# Patient Record
Sex: Male | Born: 1990 | Race: White | Hispanic: No | Marital: Married | Smoking: Never smoker
Health system: Southern US, Community
[De-identification: ages and names within clinical notes are randomized; demographics above are authoritative.]

## PROBLEM LIST (undated history)

## (undated) DIAGNOSIS — I10 Essential (primary) hypertension: Secondary | ICD-10-CM

## (undated) DIAGNOSIS — E669 Obesity, unspecified: Secondary | ICD-10-CM

## (undated) HISTORY — DX: Obesity, unspecified: E66.9

## (undated) HISTORY — DX: Essential (primary) hypertension: I10

## (undated) HISTORY — PX: KNEE ARTHROSCOPY: SUR90

---

## 2017-11-14 ENCOUNTER — Other Ambulatory Visit: Payer: Self-pay

## 2017-11-14 ENCOUNTER — Emergency Department (HOSPITAL_BASED_OUTPATIENT_CLINIC_OR_DEPARTMENT_OTHER): Payer: Self-pay

## 2017-11-14 ENCOUNTER — Emergency Department (HOSPITAL_BASED_OUTPATIENT_CLINIC_OR_DEPARTMENT_OTHER)
Admission: EM | Admit: 2017-11-14 | Discharge: 2017-11-14 | Disposition: A | Discharge status: Home / Self Care | Payer: Self-pay | Attending: Emergency Medicine | Admitting: Emergency Medicine

## 2017-11-14 ENCOUNTER — Encounter (HOSPITAL_BASED_OUTPATIENT_CLINIC_OR_DEPARTMENT_OTHER): Payer: Self-pay | Admitting: Emergency Medicine

## 2017-11-14 DIAGNOSIS — S93601A Unspecified sprain of right foot, initial encounter: Secondary | ICD-10-CM | POA: Insufficient documentation

## 2017-11-14 DIAGNOSIS — X501XXA Overexertion from prolonged static or awkward postures, initial encounter: Secondary | ICD-10-CM | POA: Insufficient documentation

## 2017-11-14 DIAGNOSIS — Y929 Unspecified place or not applicable: Secondary | ICD-10-CM | POA: Insufficient documentation

## 2017-11-14 DIAGNOSIS — Y999 Unspecified external cause status: Secondary | ICD-10-CM | POA: Insufficient documentation

## 2017-11-14 DIAGNOSIS — Y9389 Activity, other specified: Secondary | ICD-10-CM | POA: Insufficient documentation

## 2017-11-14 MED ORDER — HYDROCODONE-ACETAMINOPHEN 5-325 MG PO TABS
1.0000 | ORAL_TABLET | Freq: Once | ORAL | Status: AC
Start: 2017-11-14 — End: 2017-11-14
  Administered 2017-11-14: 1 via ORAL
  Filled 2017-11-14: qty 1

## 2017-11-14 NOTE — ED Notes (Signed)
Pt reports not taking his BP med for over 6 months. Pt educated on HTN risks and the importance of medication. Follow up information for PCP will be provided at discharge.

## 2017-11-14 NOTE — Discharge Instructions (Addendum)
No weightbearing for the next 48 hours. Often day 3, you may slowly start bearing weight as your pain or symptoms improve. Ace wrap to prevent swelling.  Elevate whenever possible.  Apply ice tonight and tomorrow. Follow-up with Dr. Magnus IvanBlackman, orthopedic surgery, if your symptoms are not resolved within 2 weeks.

## 2017-11-14 NOTE — ED Provider Notes (Signed)
[facility] EMERGENCY DEPARTMENT Provider Note   CSN: [id number] Arrival date & time: 11/14/17  1844     History   Chief Complaint Chief Complaint  Patient presents with  . Ankle Injury    HPI Marc Nguyen is a 27 y.o. male.  Chief complaint is I twisted my ankle".  HPI 27 year old male.  He was at work [employer].  He was standing on a small stool working something.  Stool tilted.  His right foot inverted.  He has pain to his right foot and ankle.  He fell but does not have additional injuries.  He cannot walk without limp.  History reviewed. No pertinent past medical history.  There are no active problems to display for this patient.   History reviewed. No pertinent surgical history.      Home Medications    Prior to Admission medications   Not on File    Family History History reviewed. No pertinent family history.  Social History Social History   Tobacco Use  . Smoking status: Never Smoker  . Smokeless tobacco: Never Used  Substance Use Topics  . Alcohol use: Never    Frequency: Never  . Drug use: Never     Allergies   Patient has no known allergies.   Review of Systems Review of Systems  Constitutional: Negative for appetite change, chills, diaphoresis, fatigue and fever.  HENT: Negative for mouth sores, sore throat and trouble swallowing.   Eyes: Negative for visual disturbance.  Respiratory: Negative for cough, chest tightness, shortness of breath and wheezing.   Cardiovascular: Negative for chest pain.  Gastrointestinal: Negative for abdominal distention, abdominal pain, diarrhea, nausea and vomiting.  Endocrine: Negative for polydipsia, polyphagia and polyuria.  Genitourinary: Negative for dysuria, frequency and hematuria.  Musculoskeletal: Positive for arthralgias and gait problem.  Skin: Negative for color change, pallor and rash.  Neurological: Negative for dizziness, syncope, light-headedness and headaches.  Hematological:  Does not bruise/bleed easily.  Psychiatric/Behavioral: Negative for behavioral problems and confusion.     Physical Exam Updated Vital Signs BP (!) 188/130 (BP Location: Left Arm) Comment: RN Theora GianottiEliza informed of Pts both BP  Pulse 95   Temp 98.9 F (37.2 C) (Oral)   Resp 20   Ht 6\' 1"  (1.854 m)   Wt 113.4 kg (250 lb)   SpO2 100%   BMI 32.98 kg/m   Physical Exam  Constitutional: He is oriented to person, place, and time. He appears well-developed and well-nourished. No distress.  HENT:  Head: Normocephalic.  Eyes: Pupils are equal, round, and reactive to light. Conjunctivae are normal. No scleral icterus.  Neck: Normal range of motion. Neck supple. No thyromegaly present.  Cardiovascular: Normal rate and regular rhythm. Exam reveals no gallop and no friction rub.  No murmur heard. Pulmonary/Chest: Effort normal and breath sounds normal. No respiratory distress. He has no wheezes. He has no rales.  Abdominal: Soft. Bowel sounds are normal. He exhibits no distension. There is no tenderness. There is no rebound.  Musculoskeletal:  Tender palpation inferior to the right lateral malleolus and onto the fourth and fifth metatarsal.  There is some soft tissue swelling and early ecchymosis overlying the fourth and fifth metatarsal heads.  Neurological: He is alert and oriented to person, place, and time.  Skin: Skin is warm and dry. No rash noted.  Psychiatric: He has a normal mood and affect. His behavior is normal.     ED Treatments / Results  Labs (all labs ordered are listed, but  only abnormal results are displayed) Labs Reviewed - No data to display  EKG None  Radiology Dg Ankle Complete Right  Result Date: 11/14/2017 CLINICAL DATA:  Right ankle injury.  Fall. EXAM: RIGHT ANKLE - COMPLETE 3+ VIEW COMPARISON:  None. FINDINGS: There is no evidence of fracture, dislocation, or joint effusion. There is no evidence of arthropathy or other focal bone abnormality. Soft tissues are  unremarkable. IMPRESSION: Negative. Electronically Signed   By: Charlett Nose M.D.   On: 11/14/2017 19:34   Dg Foot Complete Right  Result Date: 11/14/2017 CLINICAL DATA:  27 year old male with trauma to the right foot. EXAM: RIGHT FOOT COMPLETE - 3+ VIEW COMPARISON:  Right ankle radiograph dated 11/14/2017 FINDINGS: There is no acute fracture or dislocation. The bones are well mineralized. No arthritic changes. Mild hallucis valgus. There is mild soft tissue swelling of the forefoot. IMPRESSION: Negative. Electronically Signed   By: Elgie Collard M.D.   On: 11/14/2017 23:09    Procedures Procedures (including critical care time)  Medications Ordered in ED Medications  HYDROcodone-acetaminophen (NORCO/VICODIN) 5-325 MG per tablet 1 tablet (has no administration in time range)     Initial Impression / Assessment and Plan / ED Course  I have reviewed the triage vital signs and the nursing notes.  Pertinent labs & imaging results that were available during my care of the patient were reviewed by me and considered in my medical decision making (see chart for details).    X-ray of the ankle shows no fracture.  Patient sent back over for x-rays of his foot which also are normal.  No metatarsal fracture.  No malleolus fracture.  Plan he is placed in an Ace wrap and Aircast.  Crutches.  Nonweightbearing for 48 hours.  Slowly increase weightbearing as tolerated.  Ace wrap, elevation, ice, slowly increase weightbearing.  Orthopedics if not improving.  Final Clinical Impressions(s) / ED Diagnoses   Final diagnoses:  Sprain of right foot, initial encounter    ED Discharge Orders    None       Rolland Porter, MD 11/14/17 2324

## 2017-11-14 NOTE — ED Triage Notes (Signed)
Reports right ankle pain which happened today at work when he came down on his ankle wrong.

## 2017-11-25 ENCOUNTER — Ambulatory Visit (INDEPENDENT_AMBULATORY_CARE_PROVIDER_SITE_OTHER): Payer: 59 | Admitting: Physician Assistant

## 2017-11-25 ENCOUNTER — Encounter: Payer: Self-pay | Admitting: Physician Assistant

## 2017-11-25 VITALS — BP 119/90 | HR 94 | Ht 72.0 in | Wt 265.0 lb

## 2017-11-25 DIAGNOSIS — Z9189 Other specified personal risk factors, not elsewhere classified: Secondary | ICD-10-CM | POA: Diagnosis not present

## 2017-11-25 DIAGNOSIS — I10 Essential (primary) hypertension: Secondary | ICD-10-CM

## 2017-11-25 DIAGNOSIS — R0683 Snoring: Secondary | ICD-10-CM

## 2017-11-25 DIAGNOSIS — R1013 Epigastric pain: Secondary | ICD-10-CM

## 2017-11-25 DIAGNOSIS — Z1329 Encounter for screening for other suspected endocrine disorder: Secondary | ICD-10-CM

## 2017-11-25 DIAGNOSIS — R3129 Other microscopic hematuria: Secondary | ICD-10-CM | POA: Diagnosis not present

## 2017-11-25 DIAGNOSIS — Z13220 Encounter for screening for lipoid disorders: Secondary | ICD-10-CM

## 2017-11-25 DIAGNOSIS — Z7689 Persons encountering health services in other specified circumstances: Secondary | ICD-10-CM

## 2017-11-25 DIAGNOSIS — R079 Chest pain, unspecified: Secondary | ICD-10-CM

## 2017-11-25 MED ORDER — IRBESARTAN-HYDROCHLOROTHIAZIDE 150-12.5 MG PO TABS: 1.0000 | ORAL_TABLET | ORAL | 5 refills | Status: AC

## 2017-11-25 MED ORDER — ESOMEPRAZOLE MAGNESIUM 40 MG PO CPDR: 40.0000 mg | DELAYED_RELEASE_CAPSULE | Freq: Every day | ORAL | 1 refills | Status: DC

## 2017-11-25 NOTE — Progress Notes (Signed)
HPI:                                                                Marc Nguyen is a 27 y.o. male who presents to [name]: Primary Care Sports Medicine today to establish care   HTN: reports history of severe, uncontrolled hypertension in which he has been seen in the emergency room. When his blood pressure has been elevated he has had headaches, dizziness and chest pain. prescribed HCTZ and Lisinopril. Not compliant with medications due to lack of PCP/follow-up. Does not check BP's at home. He is asymptomatic today. Wife reports loud snoring and pauses in breathing. Risk factors include: male sex, obesity, family hx Mother and maternal grandfather. Emelia Loron has had a stroke. Prior ED work-up included: Negative chest x-ray on 09/15/17 ECG NSR 03/14/17 Normal CMP, CBC 08/01/2017 Trace blood on UA  Chest pain: reports chest pain both at rest and with exertion. First began about 2 years ago at age 25. He has been seen in the ED for this, as recently as January 2018. Chest pain is recurrent, lasts for minutes to hours. Reports "mild pain" with exertion such as climbing a flight of stairs. He also endorses severe, persistent acid reflux  GERD: daily persistent burning epigastric pain, worse at night. Associated with regurgitation and vomiting. No weight loss, constitutional symptoms, hematemesis, melena or hematochezia.  No flowsheet data found.  No flowsheet data found.    Past Medical History:  Diagnosis Date  . Hypertension   . Obesity    Past Surgical History:  Procedure Laterality Date  . KNEE ARTHROSCOPY Right    Social History   Tobacco Use  . Smoking status: Never Smoker  . Smokeless tobacco: Never Used  Substance Use Topics  . Alcohol use: Never    Frequency: Never   family history includes Hypertension in his maternal grandfather and mother; Stroke in his maternal grandfather.    ROS: negative except as noted in the  HPI  Medications: Current Outpatient Medications  Medication Sig Dispense Refill  . esomeprazole (NEXIUM) 40 MG capsule Take 1 capsule (40 mg total) by mouth at bedtime. 30 capsule 1  . irbesartan-hydrochlorothiazide (AVALIDE) 150-12.5 MG tablet Take 1 tablet by mouth every morning. 30 tablet 5   No current facility-administered medications for this visit.    No Known Allergies     Objective:  BP 119/90   Pulse 94   Ht 6' (1.829 m)   Wt 265 lb (120.2 kg)   BMI 35.94 kg/m  Gen:  alert, not ill-appearing, no distress, appropriate for age, obese male HEENT: head normocephalic without obvious abnormality, conjunctiva and cornea clear, trachea midline Pulm: Normal work of breathing, normal phonation, clear to auscultation bilaterally, no wheezes, rales or rhonchi CV: Normal rate, regular rhythm, s1 and s2 distinct, no murmurs, clicks or rubs  GI: abdomen soft, non-tender, no guarding or rigidity, no palpable masses, exam limited due to body habitus Neuro: alert and oriented x 3, no tremor MSK: extremities atraumatic, normal gait and station Skin: intact, no rashes on exposed skin, no jaundice, no cyanosis Psych: well-groomed, cooperative, good eye contact, euthymic mood, affect mood-congruent, speech is articulate, and thought processes clear and goal-directed    No results found for this or any  previous visit (from the past 72 hour(s)). No results found.    Assessment and Plan: 27 y.o. male with   1. Encounter to establish care - Personally reviewed PMH, PSH, PFH, medications, allergies, HM - Age-appropriate cancer screening: n/a - Influenza n/a - Tdap declines - PHQ2 negative   2. Hypertension goal BP (blood pressure) < 130/80 BP Readings from Last 3 Encounters:  11/25/17 119/90  11/14/17 (!) 167/122  - history of uncontrolled stage 2 HTN. Starting combination anti-hypertensive medication - counseled on therapeutic lifestyle changes - given young age and +  screening will test for OSA   3. At risk for obstructive sleep apnea - STOP BANG + loud snoring, + male sex, + obesity - Home sleep test; Future  4. Loud snoring - Home sleep test; Future  5. Dyspepsia - 4 week trial of high-dose PPI, GERD diet, follow-up in 4 weeks  6. Exertional chest pain - CVD risk factors include: male, obesity, HTN, fam hx - Exercise Tolerance Test; Future  Encounter to establish care  Hypertension goal BP (blood pressure) < 130/80 - Plan: irbesartan-hydrochlorothiazide (AVALIDE) 150-12.5 MG tablet, Urinalysis, Routine w reflex microscopic, BASIC METABOLIC PANEL WITH GFR, TSH + free T4, Lipid Panel w/reflex Direct LDL  At risk for obstructive sleep apnea - Plan: Home sleep test  Loud snoring - Plan: Home sleep test  Dyspepsia - Plan: esomeprazole (NEXIUM) 40 MG capsule  Exertional chest pain - Plan: Exercise Tolerance Test  Screening for thyroid disorder - Plan: TSH + free T4  Screening for lipid disorders - Plan: Lipid Panel w/reflex Direct LDL  Microscopic hematuria - Plan: Urinalysis, Routine w reflex microscopic    Patient education and anticipatory guidance given Patient agrees with treatment plan Follow-up in 4 weeks for HTN/GERD or sooner as needed if symptoms worsen or fail to improve  Levonne Hubertharley E. Tobyn Osgood PA-C

## 2017-11-25 NOTE — Patient Instructions (Addendum)
For your blood pressure: - Goal <130/80 - start Avalide every morning - monitor and log blood pressures at home - check around the same time each day in a relaxed setting - Limit salt to <2000 mg/day - Follow DASH eating plan - limit alcohol to 2 standard drinks per day for men and 1 per day for women - avoid tobacco products - weight loss: 7% of current body weight - follow-up every 6 months for your blood pressure   For your acid reflux: - start Nexium in the evening before bed - follow a GERD diet (see below) - reduce caloric intake and increase physical activity to lose weight  Food Choices for Gastroesophageal Reflux Disease, Adult When you have gastroesophageal reflux disease (GERD), the foods you eat and your eating habits are very important. Choosing the right foods can help ease your discomfort. What guidelines do I need to follow?  Choose fruits, vegetables, whole grains, and low-fat dairy products.  Choose low-fat meat, fish, and poultry.  Limit fats such as oils, salad dressings, butter, nuts, and avocado.  Keep a food diary. This helps you identify foods that cause symptoms.  Avoid foods that cause symptoms. These may be different for everyone.  Eat small meals often instead of 3 large meals a day.  Eat your meals slowly, in a place where you are relaxed.  Limit fried foods.  Cook foods using methods other than frying.  Avoid drinking alcohol.  Avoid drinking large amounts of liquids with your meals.  Avoid bending over or lying down until 2-3 hours after eating. What foods are not recommended? These are some foods and drinks that may make your symptoms worse: Vegetables Tomatoes. Tomato juice. Tomato and spaghetti sauce. Chili peppers. Onion and garlic. Horseradish. Fruits Oranges, grapefruit, and lemon (fruit and juice). Meats High-fat meats, fish, and poultry. This includes hot dogs, ribs, ham, sausage, salami, and bacon. Dairy Whole milk and  chocolate milk. Sour cream. Cream. Butter. Ice cream. Cream cheese. Drinks Coffee and tea. Bubbly (carbonated) drinks or energy drinks. Condiments Hot sauce. Barbecue sauce. Sweets/Desserts Chocolate and cocoa. Donuts. Peppermint and spearmint. Fats and Oils High-fat foods. This includes [place] fries and potato chips. Other Vinegar. Strong spices. This includes black pepper, white pepper, red pepper, cayenne, curry powder, cloves, ginger, and chili powder. The items listed above may not be a complete list of foods and drinks to avoid. Contact your dietitian for more information. This information is not intended to replace advice given to you by your health care provider. Make sure you discuss any questions you have with your health care provider. Document Released: 02/08/2012 Document Revised: 01/15/2016 Document Reviewed: 06/13/2013 Elsevier Interactive Patient Education  2017 [employer]

## 2017-12-04 ENCOUNTER — Encounter: Payer: Self-pay | Admitting: Physician Assistant

## 2017-12-04 DIAGNOSIS — R079 Chest pain, unspecified: Secondary | ICD-10-CM | POA: Insufficient documentation

## 2017-12-04 DIAGNOSIS — R1013 Epigastric pain: Secondary | ICD-10-CM | POA: Insufficient documentation

## 2017-12-04 DIAGNOSIS — I10 Essential (primary) hypertension: Secondary | ICD-10-CM | POA: Insufficient documentation

## 2017-12-04 DIAGNOSIS — Z9189 Other specified personal risk factors, not elsewhere classified: Secondary | ICD-10-CM | POA: Insufficient documentation

## 2017-12-23 ENCOUNTER — Encounter: Payer: Self-pay | Admitting: Physician Assistant

## 2017-12-23 ENCOUNTER — Ambulatory Visit (INDEPENDENT_AMBULATORY_CARE_PROVIDER_SITE_OTHER): Payer: 59 | Admitting: Physician Assistant

## 2017-12-23 VITALS — BP 163/108 | HR 83 | Wt 268.0 lb

## 2017-12-23 DIAGNOSIS — I10 Essential (primary) hypertension: Secondary | ICD-10-CM | POA: Diagnosis not present

## 2017-12-23 DIAGNOSIS — R1013 Epigastric pain: Secondary | ICD-10-CM | POA: Diagnosis not present

## 2017-12-23 MED ORDER — ESOMEPRAZOLE MAGNESIUM 20 MG PO CPDR: 20.0000 mg | DELAYED_RELEASE_CAPSULE | Freq: Every day | ORAL | 5 refills | Status: AC

## 2017-12-23 MED ORDER — VALSARTAN-HYDROCHLOROTHIAZIDE 320-25 MG PO TABS: 1.0000 | ORAL_TABLET | Freq: Every day | ORAL | 5 refills | Status: AC

## 2017-12-23 NOTE — Patient Instructions (Addendum)
For your blood pressure: - Goal <130/80 - switch to Valsartan-Hydrochlorothiazide. Take every morning - monitor and log blood pressures at home - check around the same time each day in a relaxed setting - Limit salt to <2000 mg/day - Follow DASH eating plan - limit alcohol to 2 standard drinks per day for men and 1 per day for women - avoid tobacco products - weight loss: 7% of current body weight - follow-up every 6 months for your blood pressure    DASH Eating Plan DASH stands for "Dietary Approaches to Stop Hypertension." The DASH eating plan is a healthy eating plan that has been shown to reduce high blood pressure (hypertension). It may also reduce your risk for type 2 diabetes, heart disease, and stroke. The DASH eating plan may also help with weight loss. What are tips for following this plan? General guidelines  Avoid eating more than 2,300 mg (milligrams) of salt (sodium) a day. If you have hypertension, you may need to reduce your sodium intake to 1,500 mg a day.  Limit alcohol intake to no more than 1 drink a day for nonpregnant women and 2 drinks a day for men. One drink equals 12 oz of beer, 5 oz of wine, or 1 oz of hard liquor.  Work with your health care provider to maintain a healthy body weight or to lose weight. Ask what an ideal weight is for you.  Get at least 30 minutes of exercise that causes your heart to beat faster (aerobic exercise) most days of the week. Activities may include walking, swimming, or biking.  Work with your health care provider or diet and nutrition specialist (dietitian) to adjust your eating plan to your individual calorie needs. Reading food labels  Check food labels for the amount of sodium per serving. Choose foods with less than 5 percent of the Daily Value of sodium. Generally, foods with less than 300 mg of sodium per serving fit into this eating plan.  To find whole grains, look for the word "whole" as the first word in the ingredient  list. Shopping  Buy products labeled as "low-sodium" or "no salt added."  Buy fresh foods. Avoid canned foods and premade or frozen meals. Cooking  Avoid adding salt when cooking. Use salt-free seasonings or herbs instead of table salt or sea salt. Check with your health care provider or pharmacist before using salt substitutes.  Do not fry foods. Cook foods using healthy methods such as baking, boiling, grilling, and broiling instead.  Cook with heart-healthy oils, such as olive, canola, soybean, or sunflower oil. Meal planning   Eat a balanced diet that includes: ? 5 or more servings of fruits and vegetables each day. At each meal, try to fill half of your plate with fruits and vegetables. ? Up to 6-8 servings of whole grains each day. ? Less than 6 oz of lean meat, poultry, or fish each day. A 3-oz serving of meat is about the same size as a deck of cards. One egg equals 1 oz. ? 2 servings of low-fat dairy each day. ? A serving of nuts, seeds, or beans 5 times each week. ? Heart-healthy fats. Healthy fats called Omega-3 fatty acids are found in foods such as flaxseeds and coldwater fish, like sardines, salmon, and mackerel.  Limit how much you eat of the following: ? Canned or prepackaged foods. ? Food that is high in trans fat, such as fried foods. ? Food that is high in saturated fat, such as  fatty meat. ? Sweets, desserts, sugary drinks, and other foods with added sugar. ? Full-fat dairy products.  Do not salt foods before eating.  Try to eat at least 2 vegetarian meals each week.  Eat more home-cooked food and less restaurant, buffet, and fast food.  When eating at a restaurant, ask that your food be prepared with less salt or no salt, if possible. What foods are recommended? The items listed may not be a complete list. Talk with your dietitian about what dietary choices are best for you. Grains Whole-grain or whole-wheat bread. Whole-grain or whole-wheat pasta. Brown  rice. [place] Morrow. Bulgur. Whole-grain and low-sodium cereals. Pita bread. Low-fat, low-sodium crackers. Whole-wheat flour tortillas. Vegetables Fresh or frozen vegetables (raw, steamed, roasted, or grilled). Low-sodium or reduced-sodium tomato and vegetable juice. Low-sodium or reduced-sodium tomato sauce and tomato paste. Low-sodium or reduced-sodium canned vegetables. Fruits All fresh, dried, or frozen fruit. Canned fruit in natural juice (without added sugar). Meat and other protein foods Skinless chicken or [place]. Ground chicken or [place]. Pork with fat trimmed off. Fish and seafood. Egg whites. Dried beans, peas, or lentils. Unsalted nuts, nut butters, and seeds. Unsalted canned beans. Lean cuts of beef with fat trimmed off. Low-sodium, lean deli meat. Dairy Low-fat (1%) or fat-free (skim) milk. Fat-free, low-fat, or reduced-fat cheeses. Nonfat, low-sodium ricotta or cottage cheese. Low-fat or nonfat yogurt. Low-fat, low-sodium cheese. Fats and oils Soft margarine without trans fats. Vegetable oil. Low-fat, reduced-fat, or light mayonnaise and salad dressings (reduced-sodium). Canola, safflower, olive, soybean, and sunflower oils. Avocado. Seasoning and other foods Herbs. Spices. Seasoning mixes without salt. Unsalted popcorn and pretzels. Fat-free sweets. What foods are not recommended? The items listed may not be a complete list. Talk with your dietitian about what dietary choices are best for you. Grains Baked goods made with fat, such as croissants, muffins, or some breads. Dry pasta or rice meal packs. Vegetables Creamed or fried vegetables. Vegetables in a cheese sauce. Regular canned vegetables (not low-sodium or reduced-sodium). Regular canned tomato sauce and paste (not low-sodium or reduced-sodium). Regular tomato and vegetable juice (not low-sodium or reduced-sodium). Angie Fava. Olives. Fruits Canned fruit in a light or heavy syrup. Fried fruit. Fruit in cream or butter  sauce. Meat and other protein foods Fatty cuts of meat. Ribs. Fried meat. Berniece Salines. Sausage. Bologna and other processed lunch meats. Salami. Fatback. Hotdogs. Bratwurst. Salted nuts and seeds. Canned beans with added salt. Canned or smoked fish. Whole eggs or egg yolks. Chicken or [place] with skin. Dairy Whole or 2% milk, cream, and half-and-half. Whole or full-fat cream cheese. Whole-fat or sweetened yogurt. Full-fat cheese. Nondairy creamers. Whipped toppings. Processed cheese and cheese spreads. Fats and oils Butter. Stick margarine. Lard. Shortening. Ghee. Bacon fat. Tropical oils, such as coconut, palm kernel, or palm oil. Seasoning and other foods Salted popcorn and pretzels. Onion salt, garlic salt, seasoned salt, table salt, and sea salt. Worcestershire sauce. Tartar sauce. Barbecue sauce. Teriyaki sauce. Soy sauce, including reduced-sodium. Steak sauce. Canned and packaged gravies. Fish sauce. Oyster sauce. Cocktail sauce. Horseradish that you find on the shelf. Ketchup. Mustard. Meat flavorings and tenderizers. Bouillon cubes. Hot sauce and Tabasco sauce. Premade or packaged marinades. Premade or packaged taco seasonings. Relishes. Regular salad dressings. Where to find more information:  National Heart, Lung, and [place]: https://wilson-eaton.com/  American Heart Association: www.heart.org Summary  The DASH eating plan is a healthy eating plan that has been shown to reduce high blood pressure (hypertension). It may also reduce your risk for  type 2 diabetes, heart disease, and stroke.  With the DASH eating plan, you should limit salt (sodium) intake to 2,300 mg a day. If you have hypertension, you may need to reduce your sodium intake to 1,500 mg a day.  When on the DASH eating plan, aim to eat more fresh fruits and vegetables, whole grains, lean proteins, low-fat dairy, and heart-healthy fats.  Work with your health care provider or diet and nutrition specialist (dietitian) to adjust  your eating plan to your individual calorie needs. This information is not intended to replace advice given to you by your health care provider. Make sure you discuss any questions you have with your health care provider. Document Released: 07/29/2011 Document Revised: 08/02/2016 Document Reviewed: 08/02/2016 Elsevier Interactive Patient Education  [employer].

## 2017-12-23 NOTE — Progress Notes (Signed)
HPI:                                                                Marc Nguyen is a 27 y.o. male who presents to [facility] Marc Nguyen: Primary Care Sports Medicine today for hypertension and dyspepsia follow-up   HTN: taking Irbesartan-HCTZ 150-12.5 daily. Compliant with medications. Does not monitor BP at home. Has not had any chest pain since his last office visit 1 month ago History of severe, uncontrolled hypertension in which he has been seen in the emergency room. When his blood pressure has been elevated he has had headaches, dizziness and chest pain. prescribed HCTZ and Lisinopril. Not compliant with medications due to lack of PCP/follow-up. Does not check BP's at home. He is asymptomatic today. Wife reports loud snoring and pauses in breathing. Risk factors include: male sex, obesity, family hx Mother and maternal grandfather. Marc Nguyen has had a stroke. Prior ED work-up included: Negative chest x-ray on 09/15/17 ECG NSR 03/14/17 Normal CMP, CBC 08/01/2017 Trace blood on UA  Chest pain: reports chest pain both at rest and with exertion. First began about 2 years ago at age 51. He has been seen in the ED for this, as recently as January 2018. Chest pain is recurrent, lasts for minutes to hours. Reports "mild pain" with exertion such as climbing a flight of stairs. He also endorses severe, persistent acid reflux  GERD: has been taking Nexium 40 mg for the last 4 weeks. Epigastric pain and nausea/vomiting have resoved. He has also cut out soda. No weight loss, constitutional symptoms, hematemesis, melena or hematochezia.  He has not yet scheduled his sleep study. He was waiting on his insurance card.  No flowsheet data found.  No flowsheet data found.    Past Medical History:  Diagnosis Date  . Hypertension   . Obesity    Past Surgical History:  Procedure Laterality Date  . KNEE ARTHROSCOPY Right    Social History   Tobacco Use  . Smoking status: Never  Smoker  . Smokeless tobacco: Never Used  Substance Use Topics  . Alcohol use: Never    Frequency: Never   family history includes Hypertension in his maternal grandfather and mother; Stroke in his maternal grandfather.    ROS: negative except as noted in the HPI  Medications: Current Outpatient Medications  Medication Sig Dispense Refill  . esomeprazole (NEXIUM) 40 MG capsule Take 1 capsule (40 mg total) by mouth at bedtime. 30 capsule 1  . irbesartan-hydrochlorothiazide (AVALIDE) 150-12.5 MG tablet Take 1 tablet by mouth every morning. 30 tablet 5   No current facility-administered medications for this visit.    No Known Allergies     Objective:  BP (!) 163/108   Pulse 83   Wt 268 lb (121.6 kg)   BMI 36.35 kg/m  Gen:  alert, not ill-appearing, no distress, appropriate for age, obese male HEENT: head normocephalic without obvious abnormality, conjunctiva and cornea clear, wearing glasses, trachea midline,  Pulm: Normal work of breathing, normal phonation, clear to auscultation bilaterally, no wheezes, rales or rhonchi CV: Normal rate, regular rhythm, s1 and s2 distinct, no murmurs, clicks or rubs; radial pulses 2+ symmetric, no carotid bruit Neuro: alert and oriented x 3, no tremor MSK: extremities atraumatic, normal gait and  station Skin: intact, no rashes on exposed skin, no jaundice, no cyanosis Psych: well-groomed, cooperative, good eye contact, euthymic mood, affect mood-congruent, speech is articulate, and thought processes clear and goal-directed    No results found for this or any previous visit (from the past 72 hour(s)). No results found.    Assessment and Plan: 27 y.o. male with   Uncontrolled stage 2 hypertension  Dyspepsia - Plan: esomeprazole (NEXIUM) 20 MG capsule  Hypertension goal BP (blood pressure) < 130/80 - Plan: valsartan-hydrochlorothiazide (DIOVAN-HCT) 320-25 MG tablet  BP Readings from Last 3 Encounters:  12/23/17 (!) 163/108   11/25/17 119/90  11/14/17 (!) 167/122  - BP severely out of range today despite ARB-thiazide medication. Sleep study is pending. Switching to max dose Valsartan-HCTZ 320-25. Counseled on therapeutic lifestyle changes. Patient to monitor BP at home and return for nurse BP check in 2 weeks. - Chest pain has improved since starting Nexium. Still recommend exercise stress test given CVD risk factors. This was ordered 1 month ago but has not been scheduled. - reducing Nexium to 20 mg nightly  Patient education and anticipatory guidance given Patient agrees with treatment plan Follow-up in 2weeks for nurse BP check or sooner as needed if symptoms worsen or fail to improve  Marc Hubert PA-C

## 2018-01-06 ENCOUNTER — Ambulatory Visit: Payer: 59

## 2018-03-07 ENCOUNTER — Encounter: Payer: Self-pay | Admitting: Cardiovascular Disease

## 2019-05-17 IMAGING — DX DG ANKLE COMPLETE 3+V*R*
3 series · 3 of 3 positions shown · non-contrast
Comparison: None.

CLINICAL DATA: Right ankle injury.  Fall.

EXAM:
RIGHT ANKLE - COMPLETE 3+ VIEW

[ankle ap]
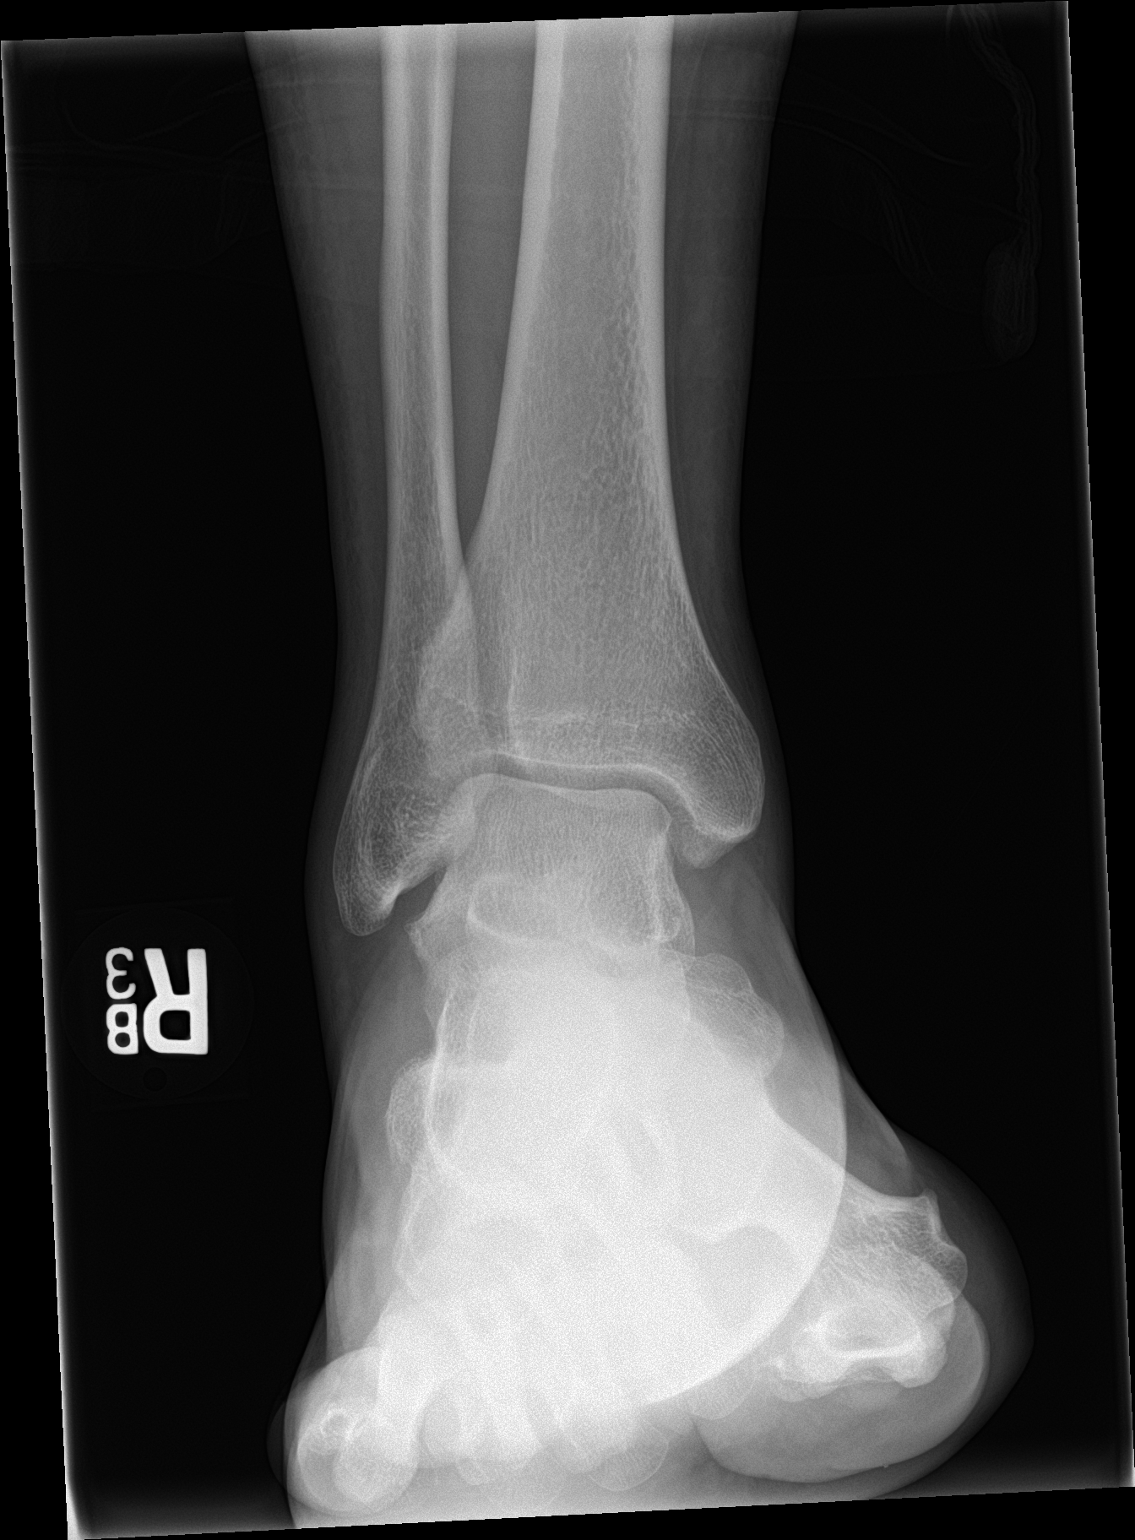

[ankle obl]
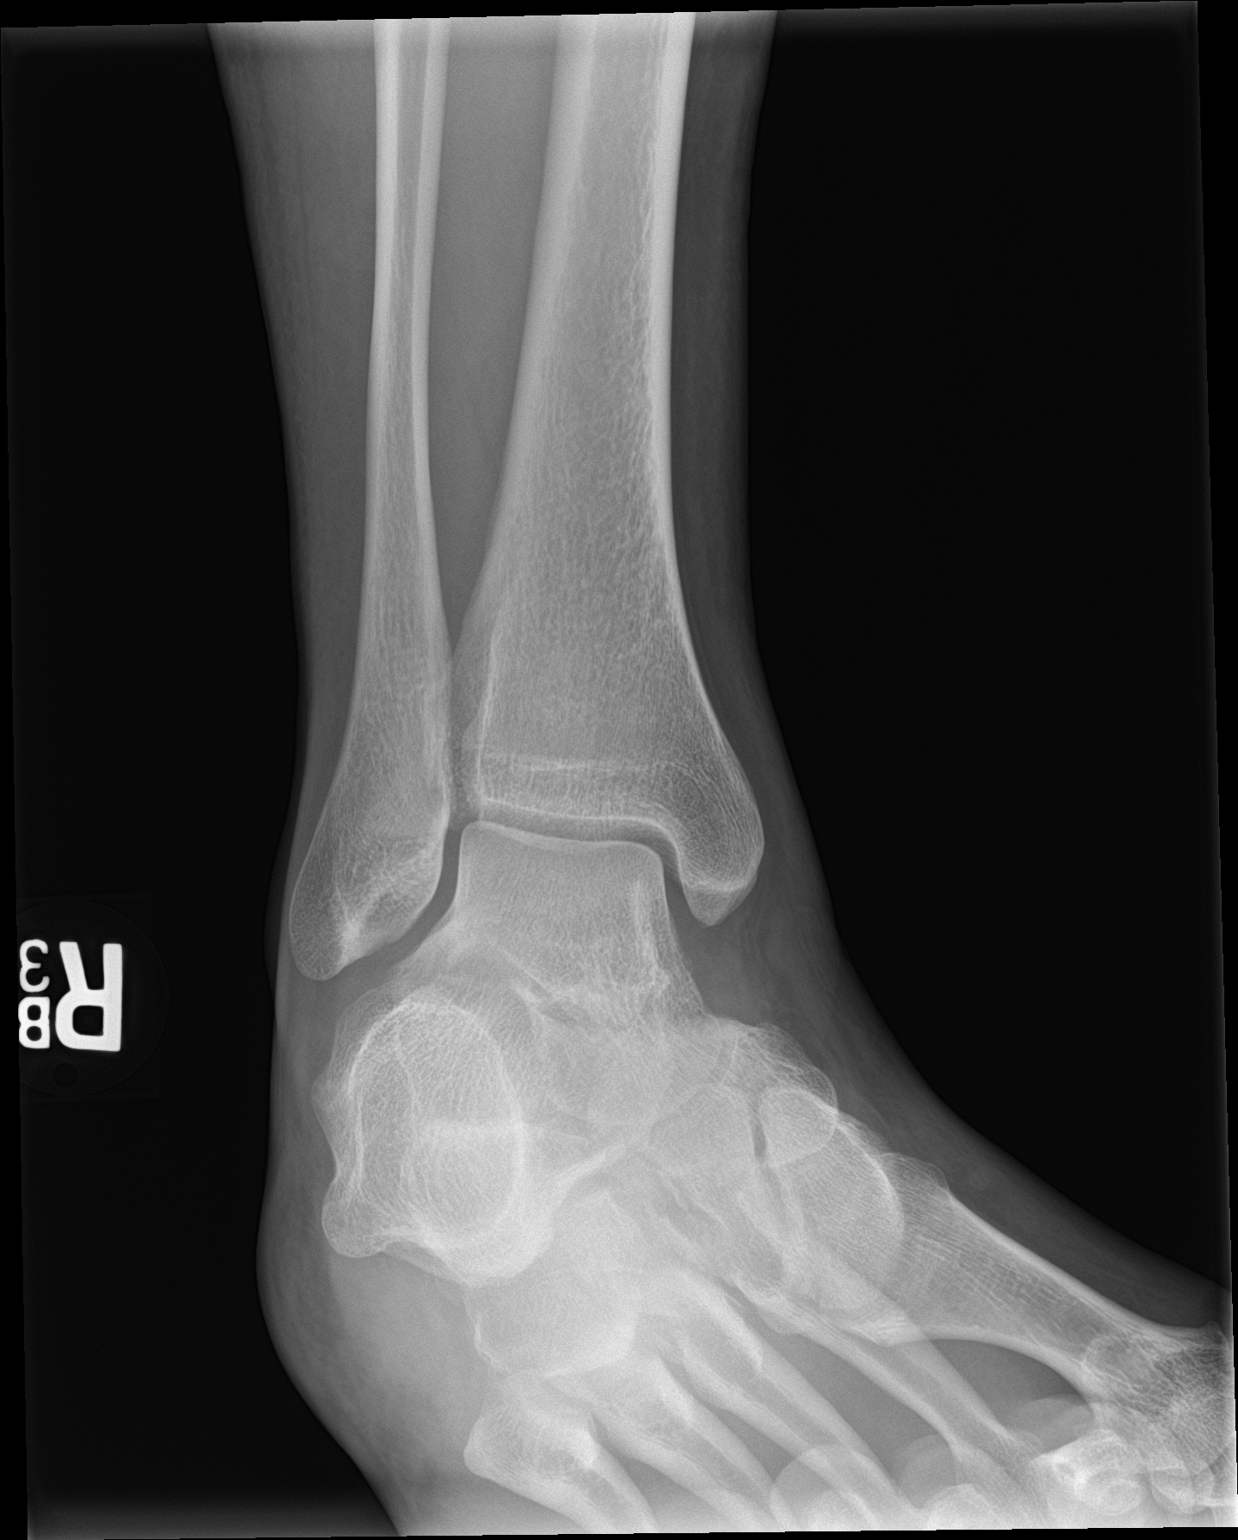

[ankle lat]
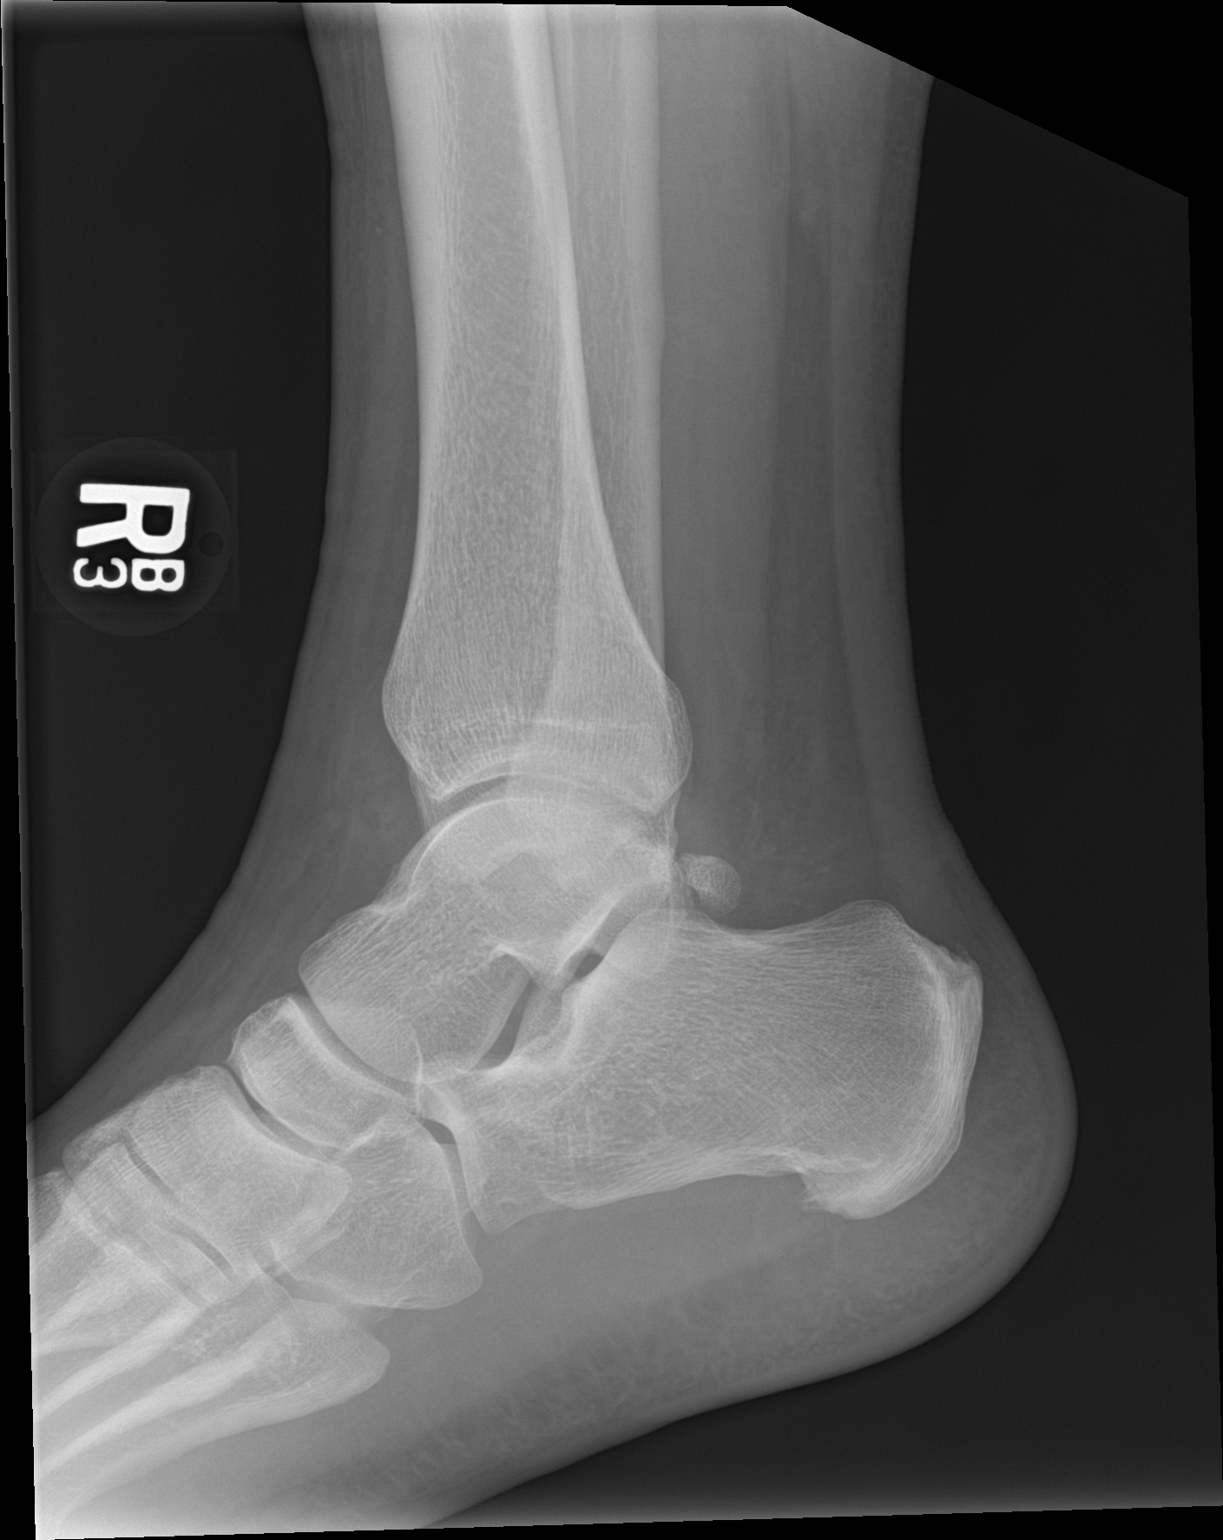

[3 of 3 positions shown; findings below may reference images not displayed]

FINDINGS: There is no evidence of fracture, dislocation, or joint effusion.
There is no evidence of arthropathy or other focal bone abnormality.
Soft tissues are unremarkable.
IMPRESSION: Negative.

## 2019-05-17 IMAGING — DX DG FOOT COMPLETE 3+V*R*
3 series · 3 of 3 positions shown · non-contrast
Comparison: Right ankle radiograph dated 11/14/2017

CLINICAL DATA: 26-year-old male with trauma to the right foot.

EXAM:
RIGHT FOOT COMPLETE - 3+ VIEW

[foot ap]
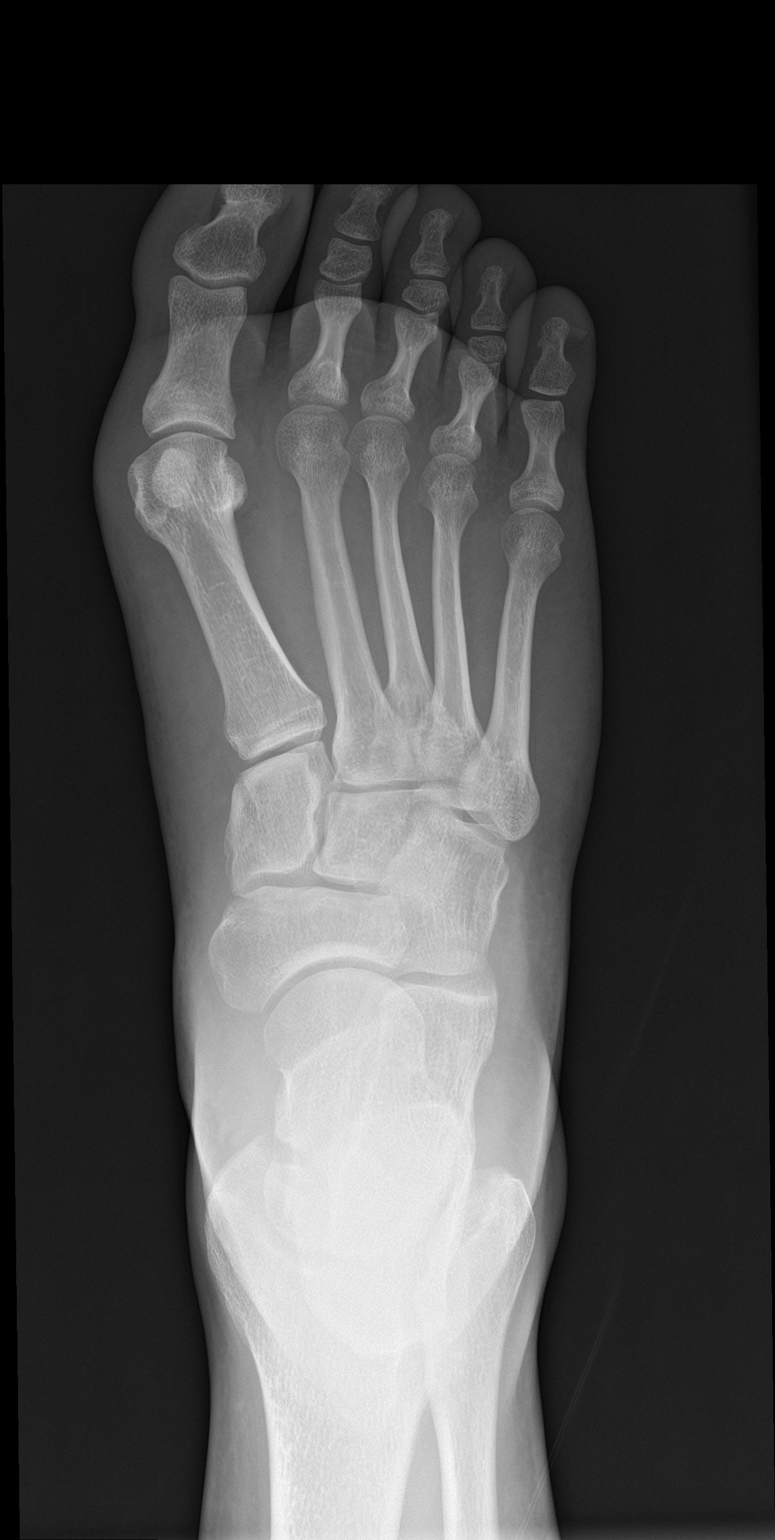

[foot obl]
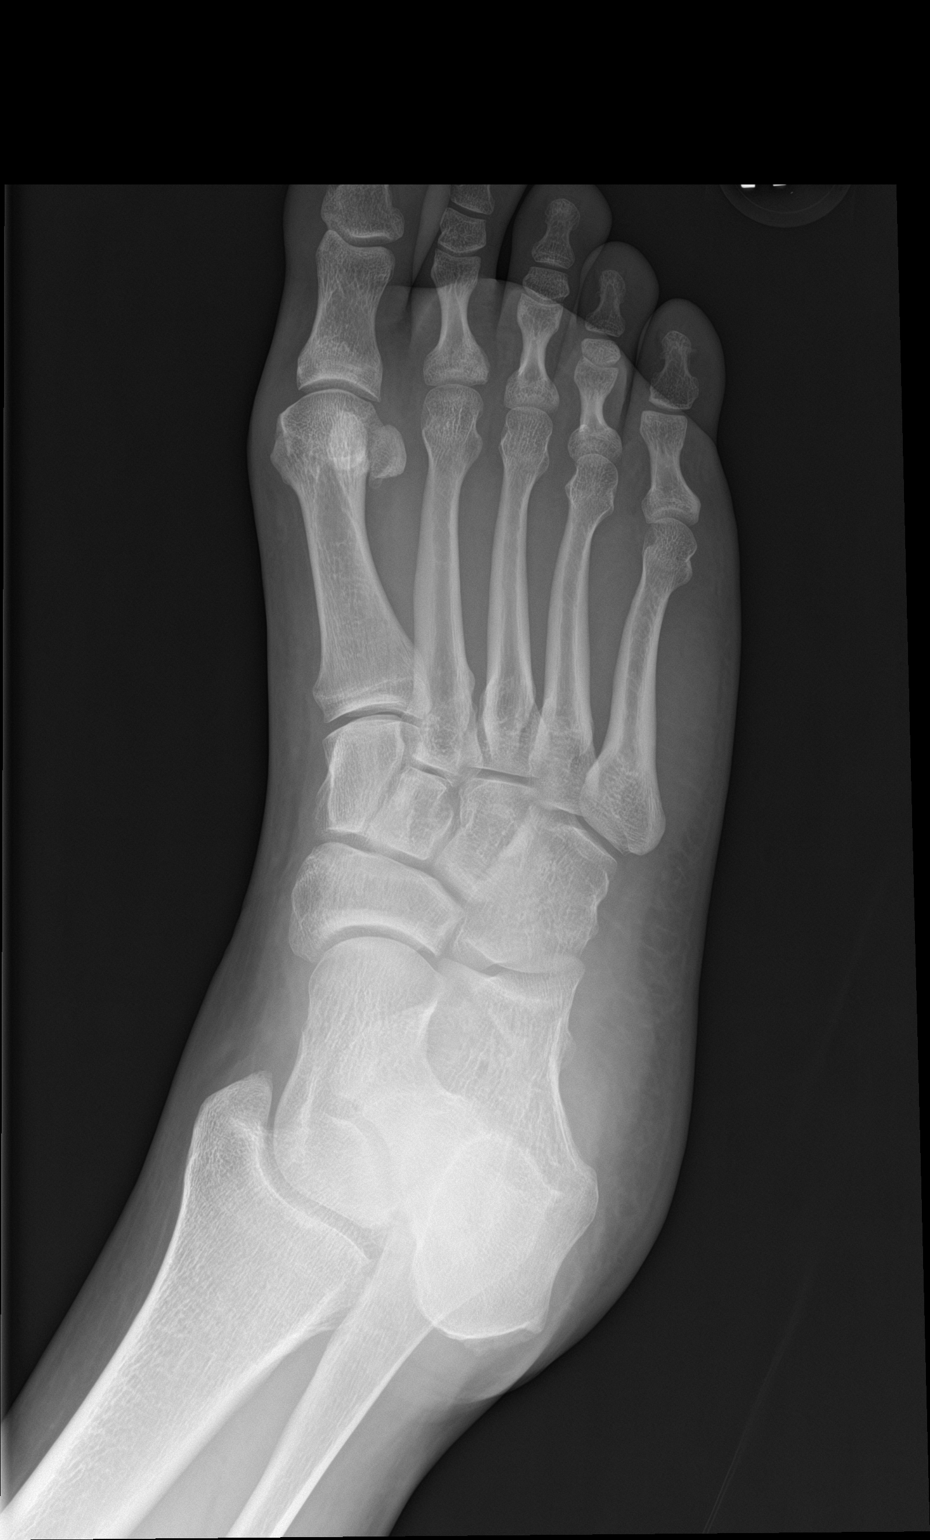

[foot lat]
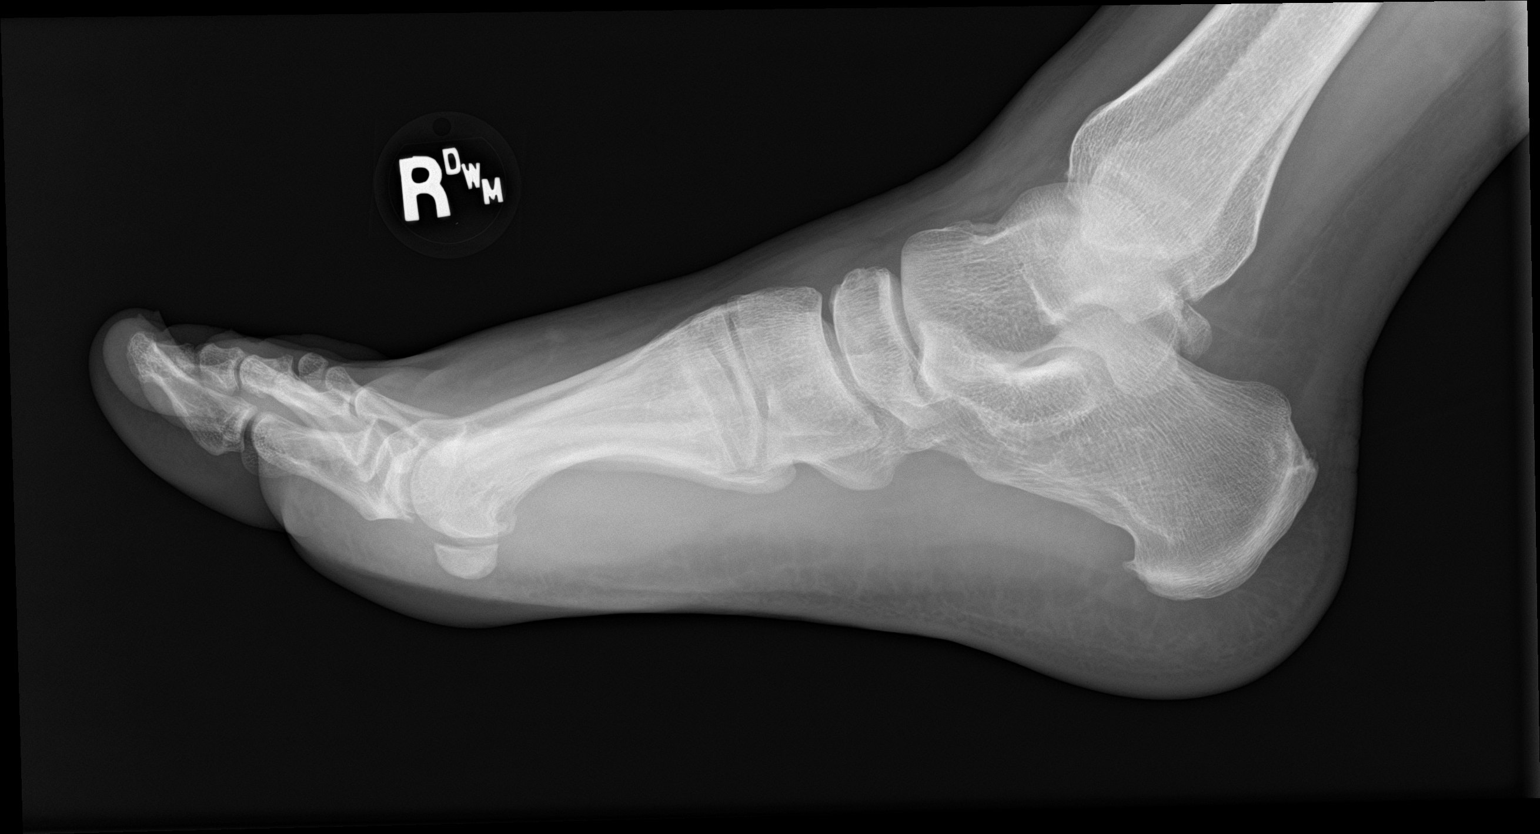

[3 of 3 positions shown; findings below may reference images not displayed]

FINDINGS: There is no acute fracture or dislocation. The bones are well
mineralized. No arthritic changes. Mild hallucis valgus. There is
mild soft tissue swelling of the forefoot.
IMPRESSION: Negative.

## 2024-06-20 ENCOUNTER — Other Ambulatory Visit (HOSPITAL_BASED_OUTPATIENT_CLINIC_OR_DEPARTMENT_OTHER): Payer: Self-pay | Admitting: Student

## 2024-06-20 DIAGNOSIS — M5412 Radiculopathy, cervical region: Secondary | ICD-10-CM

## 2024-06-20 DIAGNOSIS — M5416 Radiculopathy, lumbar region: Secondary | ICD-10-CM

## 2024-06-26 ENCOUNTER — Ambulatory Visit (HOSPITAL_BASED_OUTPATIENT_CLINIC_OR_DEPARTMENT_OTHER)
Admission: RE | Admit: 2024-06-26 | Discharge: 2024-06-26 | Disposition: A | Discharge status: Home / Self Care | Source: Ambulatory Visit | Attending: Student | Admitting: Student

## 2024-06-26 DIAGNOSIS — M5412 Radiculopathy, cervical region: Secondary | ICD-10-CM | POA: Insufficient documentation

## 2024-06-26 DIAGNOSIS — M5416 Radiculopathy, lumbar region: Secondary | ICD-10-CM | POA: Diagnosis present

## 2024-07-25 ENCOUNTER — Encounter: Payer: Self-pay | Admitting: Student

## 2024-07-26 ENCOUNTER — Other Ambulatory Visit: Payer: Self-pay | Admitting: Student

## 2024-07-26 DIAGNOSIS — M5414 Radiculopathy, thoracic region: Secondary | ICD-10-CM

## 2024-07-30 ENCOUNTER — Encounter: Payer: Self-pay | Admitting: Student

## 2024-07-31 NOTE — Discharge Instructions (Signed)

## 2024-08-01 ENCOUNTER — Other Ambulatory Visit: Payer: Self-pay | Admitting: Student

## 2024-08-01 ENCOUNTER — Inpatient Hospital Stay: Admission: RE | Admit: 2024-08-01 | Discharge: 2024-08-01 | Attending: Student

## 2024-08-01 ENCOUNTER — Inpatient Hospital Stay
Admission: RE | Admit: 2024-08-01 | Discharge: 2024-08-01 | Disposition: A | Discharge status: Home / Self Care | Payer: Self-pay | Source: Ambulatory Visit | Attending: Student | Admitting: Student

## 2024-08-01 DIAGNOSIS — M5414 Radiculopathy, thoracic region: Secondary | ICD-10-CM

## 2024-08-01 MED ORDER — DIAZEPAM 5 MG PO TABS
10.0000 mg | ORAL_TABLET | Freq: Once | ORAL | Status: AC
  Administered 2024-08-01: 10 mg via ORAL

## 2024-08-01 MED ORDER — ONDANSETRON HCL 4 MG/2ML IJ SOLN: 4.0000 mg | Freq: Once | INTRAMUSCULAR | Status: DC | PRN

## 2024-08-01 MED ORDER — IOPAMIDOL (ISOVUE-M 300) INJECTION 61%
10.0000 mL | Freq: Once | INTRAMUSCULAR | Status: AC
  Administered 2024-08-01: 11:00:00 10 mL via INTRATHECAL

## 2024-08-01 MED ORDER — MEPERIDINE HCL 50 MG/ML IJ SOLN: 50.0000 mg | Freq: Once | INTRAMUSCULAR | Status: DC | PRN

## 2024-09-10 NOTE — Therapy (Incomplete)
 " OUTPATIENT PHYSICAL THERAPY THORACOLUMBAR EVALUATION   Patient Name: Marc Nguyen MRN: [id number] DOB:1990/08/25, 34 y.o., male Today's Date: 09/10/2024  END OF SESSION:   Past Medical History:  Diagnosis Date   Hypertension    Obesity    Past Surgical History:  Procedure Laterality Date   KNEE ARTHROSCOPY Right    Patient Active Problem List   Diagnosis Date Noted   Hypertension goal BP (blood pressure) < 130/80 12/04/2017   At risk for obstructive sleep apnea 12/04/2017   Dyspepsia 12/04/2017   Exertional chest pain 12/04/2017    PCP: Tommas Severa Norris, PA-C (Inactive)   REFERRING PROVIDER: Orlean Suzen Lacks, NP *** (remove blue hyperlink)  REFERRING DIAG: M54.14 (ICD-10-CM) - Radiculopathy, thoracic region   THERAPY DIAG:  No diagnosis found.  RATIONALE FOR EVALUATION AND TREATMENT: {HABREHAB:27488}  ONSET DATE: ***  NEXT MD VISIT: ***   SUBJECTIVE:                                                                                                                                                                                                         SUBJECTIVE STATEMENT: ***  ***34 year old patient comes in today to review the results of his CT myelogram of thoracic spine.  He continues to have been burning sensation in his back.  He has tried physical therapy which actually made his pain worse.  His cervical and lumbar MRI were unremarkable.  The pain wakes him up at night.  He states that he has pain all the time.  Takes over-the-counter medication with little relief. ***  PAIN: Are you having pain? {OPRCPAIN:27236}  PERTINENT HISTORY:  ***  PRECAUTIONS: {Therapy precautions:24002}  RED FLAGS: {PT Red Flags:29287}  WEIGHT BEARING RESTRICTIONS: {Yes ***/No:24003}  FALLS:  Has patient fallen in last 6 months? {fallsyesno:27318}  LIVING ENVIRONMENT: Lives with: {OPRC lives with:25569::lives with their family} Lives in: {Lives  in:25570} Stairs: {opstairs:27293} Has following equipment at home: {Assistive devices:23999}  OCCUPATION: ***  PLOF: {PLOF:24004}  PATIENT GOALS: ***   OBJECTIVE: (objective measures completed at initial evaluation unless otherwise dated)  DIAGNOSTIC FINDINGS:  CT myelogram thoracic spine: Per NP report, he does have several bridging anterior osteophyte, no spinal cord compression or foraminal narrowing.  He does have scoliosis as well.  08/01/24 - THORACIC MYELOGRAM UNDER FLUOROSCOPY / CT OF THE THORACIC SPINE WITH INTRATHECAL CONTRAST CLINICAL HISTORY: Thoracic Radiculopathy   FINDINGS:   BONES AND ALIGNMENT: 12th rib - bearing thoracic segments assigned at T1 - T12. There is a mild mid thoracic dextroscoliosis with apex at T8 without evident  underlying vertebral segmentation anomaly. The vertebral body heights are maintained, with the exception of a mild wedge deformity of T7 right greater than left. No osseous destructive lesion is seen.   DEGENERATIVE CHANGES: C7-T3: Unremarkable.   T3-T4: Early left costovertebral degenerative joint disease. Central canal and foramina are patent.   T4-T6: Unremarkable.   T6-T7: Mild wedge deformity of T7 body left greater than right with bridging lateral endplate osteophytes. Right facet degenerative joint disease resulting in mild foraminal encroachment.   T7-T8: Moderate wedge deformity of the T8 body, left greater than right, with bridging left lateral endplate osteophytes.   T8-T10: Continued left lateral bridging by confluent endplate osteophytes.  T10-11: Anterior endplate spurs. Left facet degenerative joint disease with mild encroachment upon the neural foramen. Central canal widely patent.   T11-T12: Moderate anterior endplate osteophytes. Mild posterior disc bulge without cord distortion. No spinal stenosis. Foramina are patent.   T12-L1: Small anterior endplate spurs with early vacuum phenomenon. No disc  bulge or protrusion. Central canal and foramina are patent.   Conus terminates behind L1. The visualized upper lumbar spine is unremarkable.   SOFT TISSUES: No paraspinal mass or fluid collection. Small hiatal hernia. The remainder of the visualized paraspinal soft tissues are unremarkable.   IMPRESSION: 1. Mild mid thoracic dextroscoliosis with apex at T8 without evident vertebral segmentation anomaly. 2. Mild wedge deformity of T7 right greater than left and moderate wedge deformity of T8 left greater than right, with associated bridging osteophytes. 3. Right facet degenerative joint disease at T6-T7 resulting in mild foraminal encroachment. 4. Left facet degenerative joint disease at T10-11 with mild foraminal encroachment; central canal is widely patent. 5. Mild posterior disc bulge at T11-T12 without spinal stenosis; foramina are patent.  06/26/24 - MRI CERVICAL SPINE WITHOUT CONTRAST   CLINICAL DATA:  Weakness and numbness in the arms and legs   FINDINGS: The craniocervical junction is normal.   There is no significant bone marrow signal abnormality.   The cervical spinal cord is normal.   C2-C3: Normal   C3-C4: Normal   C4-C5: Normal   C5-C6: Normal   C6-C7: Normal   C7-T1: Normal   IMPRESSION: Normal  06/26/24 - MRI LUMBAR SPINE WITHOUT CONTRAST CLINICAL DATA:  Back pain with weakness of the arms and legs  FINDINGS: Bone marrow: No significant abnormality   Conus and cauda equina: No significant abnormality   Paraspinal tissues: No significant abnormality   L1-L2: Normal   L2-L3: Normal   L3-L4: Normal   L4-L5: There is a small foraminal disc herniation on the right with mild stenosis of the right neural foramen   L5-S1: Slight desiccation of the disc with preservation of the disc height. The facet joints are normal.   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   Otherwise normal.  PATIENT SURVEYS:  Modified Oswestry:  MODIFIED OSWESTRY DISABILITY SCALE  Date:  Score - 09/10/2024 ***  Pain intensity {ODI 1:32962}  2. Personal care (washing, dressing, etc.) {ODI 2:32963}  3. Lifting {ODI 3:32964}  4. Walking {ODI 4:32965}  5. Sitting {ODI 5:32966}  6. Standing {ODI 6:32967}  7. Sleeping {ODI 7:32968}  8. Social Life {ODI 8:32969}  9. Traveling {ODI 9:32970}  10. Employment/ Homemaking {ODI 10:32971}  Total ***/50  % Disability    Interpretation of scores: Score Category Description  0-20% Minimal Disability The patient can cope with most living activities.  Usually no treatment is indicated apart from advice on lifting, sitting and exercise  21-40% Moderate Disability The patient experiences more pain and difficulty with sitting, lifting and standing. Travel and social life are more difficult and they may be disabled from work. Personal care, sexual activity and sleeping are not grossly affected, and the patient can usually be managed by conservative means  41-60% Severe Disability Pain remains the main problem in this group, but activities of daily living are affected. These patients require a detailed investigation  61-80% Crippled Back pain impinges on all aspects of the patients life. Positive intervention is required  81-100% Bed-bound These patients are either bed-bound or exaggerating their symptoms  Bluford FORBES Zoe DELENA Karon DELENA, et al. Surgery versus conservative management of stable thoracolumbar fracture: the PRESTO feasibility RCT. [place] ([place]): [employer]; 2021 Nov. [facility] Technology Assessment, No. 25.62.) Appendix 3, Oswestry Disability Index category descriptors. Available from: Findjewelers.cz  Minimally Clinically Important Difference (MCID) = 12.8%  SCREENING FOR RED FLAGS: Bowel or bladder incontinence: {Yes/No:304960894} Spinal tumors:  {Yes/No:304960894} Cauda equina syndrome: {Yes/No:304960894} Compression fracture: {Yes/No:304960894} Abdominal aneurysm: {Yes/No:304960894}  COGNITION:  Overall cognitive status: {cognition:24006}    SENSATION: {sensation:27233}  POSTURE:  {posture:25561}  PALPATION: ***  THORACOLUMBAR ROM:   {AROM/PROM:27142}  Eval  Flexion   Extension   Right lateral flexion   Left lateral flexion   Right rotation   Left rotation   (Blank rows = not tested)  UPPER EXTREMITY ROM:  Active ROM Right eval Left eval  Shoulder flexion    Shoulder extension    Shoulder abduction    Shoulder adduction    Shoulder internal rotation    Shoulder external rotation    Elbow flexion    Elbow extension    Wrist flexion    Wrist extension    Wrist ulnar deviation    Wrist radial deviation    Wrist pronation    Wrist supination     (Blank rows = not tested)  UPPER EXTREMITY MMT:  MMT Right eval Left eval  Shoulder flexion    Shoulder extension    Shoulder abduction    Shoulder adduction    Shoulder extension    Shoulder internal rotation    Shoulder external rotation    Middle trapezius    Lower trapezius    Elbow flexion    Elbow extension    Wrist flexion    Wrist extension    Wrist ulnar deviation    Wrist radial deviation    Wrist pronation    Wrist supination    Grip strength     (Blank rows = not tested)  MUSCLE LENGTH: Hamstrings: Right *** deg; Left *** deg Thomas test: Right *** deg; Left *** deg Hamstrings: *** ITB: *** Piriformis: *** Hip flexors: *** Quads: *** Heelcord: ***  LOWER EXTREMITY ROM:     Active  Right eval Left eval  Hip flexion    Hip extension    Hip abduction    Hip adduction    Hip internal rotation    Hip external rotation    Knee flexion    Knee extension    Ankle dorsiflexion    Ankle plantarflexion    Ankle inversion    Ankle eversion    (Blank rows = not tested)  LOWER EXTREMITY MMT:    MMT Right eval Left eval   Hip flexion    Hip extension    Hip abduction    Hip adduction    Hip  internal rotation    Hip external rotation    Knee flexion    Knee extension    Ankle dorsiflexion    Ankle plantarflexion    Ankle inversion    Ankle eversion     (Blank rows = not tested)  LUMBAR SPECIAL TESTS:  {lumbar special test:25242}  FUNCTIONAL TESTS:  {Functional tests:24029}  GAIT: Distance walked: *** Assistive device utilized: {Assistive devices:23999} Level of assistance: {Levels of assistance:24026} Gait pattern: {gait characteristics:25376} Comments: ***   TODAY'S TREATMENT:   *** 09/10/2024 - Eval SELF CARE:  Reviewed eval findings and role of PT in addressing identified deficits as well as instruction in initial HEP (see below).    PATIENT EDUCATION:  Education details: {Education details:27468}  Person educated: {Person educated:25204} Education method: {Education Method EU:67125} Education comprehension: {Education Comprehension:25206}  HOME EXERCISE PROGRAM: ***   ASSESSMENT:  CLINICAL IMPRESSION: Marc Nguyen is a 34 y.o. male who was referred to physical therapy for evaluation and treatment for thoracic radiculopathy.  CT myelogram thoracic spine reveals several bridging anterior osteophyte and scoliosis, but no spinal cord compression or foraminal narrowing.  Cervical and lumbar MRIs WNL.  Patient reports onset of *** pain beginning ***.  Pain is worse with ***.  Patient has deficits in *** ROM, *** LE flexibility, *** strength, abnormal posture, and TTP with abnormal muscle tension *** which are interfering with ADLs and are impacting quality of life.  On Modified Oswestry patient scored ***/50 demonstrating ***% or *** disability.  Marc Nguyen will benefit from skilled PT to address above deficits to improve mobility and activity tolerance with decreased pain interference.   ***  OBJECTIVE IMPAIRMENTS: {opptimpairments:25111}.   ACTIVITY LIMITATIONS:  {activitylimitations:27494}  PARTICIPATION LIMITATIONS: {participationrestrictions:25113}  PERSONAL FACTORS: {Personal factors:25162} are also affecting patient's functional outcome.   REHAB POTENTIAL: {rehabpotential:25112}  CLINICAL DECISION MAKING: {clinical decision making:25114}  EVALUATION COMPLEXITY: {Evaluation complexity:25115}   GOALS: Goals reviewed with patient? {yes/no:20286}  SHORT TERM GOALS: Target date: ***  Patient will be independent with initial HEP to improve outcomes and carryover.  Baseline: *** Goal status: {GOALSTATUS:25110}  2.  Patient will report 25% improvement in thoracic back pain to improve QOL. Baseline: *** Goal status: {GOALSTATUS:25110}  3.  *** Baseline:  Goal status: {GOALSTATUS:25110}   LONG TERM GOALS: Target date: ***  Patient will be independent with ongoing/advanced HEP for self-management at home.  Baseline: *** Goal status: {GOALSTATUS:25110}  2.  Patient will report 50-75% improvement in thoracic back pain to improve QOL.  Baseline: *** Goal status: {GOALSTATUS:25110}  3.  Patient to demonstrate ability to achieve and maintain good spinal alignment/posturing and body mechanics needed for daily activities. Baseline: *** Goal status: {GOALSTATUS:25110}  4.  Patient will demonstrate full pain free lumbar ROM to perform ADLs.   Baseline: Refer to above lumbar ROM table Goal status: {GOALSTATUS:25110}  5.  Patient will demonstrate improved *** strength to >/= ***/5 for improved stability and ease of mobility. Baseline: Refer to above ***UE/LE MMT table Goal status: {GOALSTATUS:25110}  6. Patient will report </= ***% on Modified Oswestry (MCID = 12%) to demonstrate improved functional ability with decreased pain interference. Baseline: *** Goal status: {GOALSTATUS:25110}  7.  Patient will tolerate *** min of (standing/sitting/walking) w/o increased pain to allow for *** improved mobility and activity tolerance. Baseline:  *** Goal status: {GOALSTATUS:25110}  8.  Patient will report centralization of radicular symptoms.  Baseline: *** Goal status: {GOALSTATUS:25110}  9.  Patient will ***.  Baseline: *** Goal status: {GOALSTATUS:25110}   10.  ***  Baseline: *** Goal status: {GOALSTATUS:25110}   PLAN:  PT FREQUENCY: {rehab frequency:25116}  PT DURATION: {rehab duration:25117}  PLANNED INTERVENTIONS: 97164- PT Re-evaluation, 97110-Therapeutic exercises, 97530- Therapeutic activity, 97112- Neuromuscular re-education, 97535- Self Care, 02859- Manual therapy, [id number]- Electrical stimulation (unattended), [phone]- Electrical stimulation (manual), [id number]- Traction (mechanical), [id number]- Ionotophoresis 4mg /ml Dexamethasone, 79439 (1-2 muscles), 20561 (3+ muscles)- Dry Needling, Patient/Family education, Taping, Joint mobilization, Spinal mobilization, Cryotherapy, and Moist heat  PLAN FOR NEXT SESSION: ***   Marc Nguyen CHRISTELLA Hidden, PT 09/10/2024, 6:25 PM  "

## 2024-09-13 ENCOUNTER — Ambulatory Visit: Admitting: Physical Therapy

## 2024-09-14 ENCOUNTER — Encounter: Payer: Self-pay | Admitting: Physical Therapy

## 2024-09-14 ENCOUNTER — Ambulatory Visit: Attending: Student | Admitting: Physical Therapy

## 2024-09-14 ENCOUNTER — Other Ambulatory Visit: Payer: Self-pay

## 2024-09-14 DIAGNOSIS — M6281 Muscle weakness (generalized): Secondary | ICD-10-CM | POA: Diagnosis present

## 2024-09-14 DIAGNOSIS — M546 Pain in thoracic spine: Secondary | ICD-10-CM | POA: Insufficient documentation

## 2024-09-14 DIAGNOSIS — R293 Abnormal posture: Secondary | ICD-10-CM | POA: Insufficient documentation

## 2024-09-14 DIAGNOSIS — M62830 Muscle spasm of back: Secondary | ICD-10-CM | POA: Insufficient documentation

## 2024-09-14 DIAGNOSIS — M5414 Radiculopathy, thoracic region: Secondary | ICD-10-CM | POA: Insufficient documentation

## 2024-09-14 NOTE — Therapy (Signed)
 " OUTPATIENT PHYSICAL THERAPY THORACOLUMBAR EVALUATION   Patient Name: Marc Nguyen MRN: [id number] DOB:26-Oct-1990, 33 y.o., male Today's Date: 09/14/2024  END OF SESSION:  PT End of Session - 09/14/24 0931     Visit Number 1    Date for Recertification  11/09/24    Authorization Type [employer]    PT Start Time [phone]    PT Stop Time 1025    PT Time Calculation (min) 54 min    Activity Tolerance Patient limited by pain    Behavior During Therapy [facility] for tasks assessed/performed          Past Medical History:  Diagnosis Date   Hypertension    Obesity    Past Surgical History:  Procedure Laterality Date   KNEE ARTHROSCOPY Right    Patient Active Problem List   Diagnosis Date Noted   Hypertension goal BP (blood pressure) < 130/80 12/04/2017   At risk for obstructive sleep apnea 12/04/2017   Dyspepsia 12/04/2017   Exertional chest pain 12/04/2017    PCP: Tommas Severa Norris, PA-C (Inactive)   REFERRING PROVIDER: Orlean Suzen Lacks, NP   REFERRING DIAG: M54.14 (ICD-10-CM) - Radiculopathy, thoracic region   THERAPY DIAG:  Pain in thoracic spine  Radiculopathy, thoracic region  Abnormal posture  Muscle spasm of back  Muscle weakness (generalized)  RATIONALE FOR EVALUATION AND TREATMENT: Rehabilitation  ONSET DATE: April 2025  NEXT MD VISIT: PRN   SUBJECTIVE:                                                                                                                                                                                                         SUBJECTIVE STATEMENT: Pt report constant R-sided thoracic back pain between shoulder blade and his spine.  Pain started gradually, but was then under a house putting some ductwork up when pain became unbearable.  Pain affects walking and makes rolling over in bed very painful, waking him up at night.  Injections and ablation attempts have been unsuccessful in providing pain relief.  He  reports occasional numbness and tingling in B UE & LE early on, but not as common recently.  Tried PT last year, but feels like this made things worse - slightly better response to seated thoracolumbar flexion stretch (relief not lasting) but thoracolumbar extension at wall and doorway pec stretch made things worse.  PAIN: Are you having pain? Yes: NPRS scale: 8/10 currently, up to 10/10  Pain location: R thoracic spine between scapula and spine and under scapula  Pain description: constant pressure, sharp stabbing pain  with certain movements  Aggravating factors: bending over, STS transition, prolonged stationary positioning, walking, driving/riding in car or motorcycle  Relieving factors: heating pad, stretches from prior HEP - flexion based better extension   PERTINENT HISTORY:  HTN, obesity, dyspepsia, R knee scope  PRECAUTIONS: None  RED FLAGS: None  WEIGHT BEARING RESTRICTIONS: No  FALLS:  Has patient fallen in last 6 months? Yes. 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   OBJECTIVE: (objective measures completed at initial evaluation unless otherwise dated)  DIAGNOSTIC FINDINGS:  CT myelogram thoracic spine: Per NP report, he does have several bridging anterior osteophyte, no spinal cord compression or foraminal narrowing.  He does have scoliosis as well.  08/01/24 - THORACIC MYELOGRAM  UNDER FLUOROSCOPY / CT OF THE THORACIC SPINE WITH INTRATHECAL CONTRAST CLINICAL HISTORY: Thoracic Radiculopathy   FINDINGS:   BONES AND ALIGNMENT: 12th rib - bearing thoracic segments assigned at T1 - T12. There is a mild mid thoracic dextroscoliosis with apex at T8 without evident underlying vertebral segmentation anomaly. The vertebral body heights are maintained, with the exception of a mild wedge deformity of T7 right greater than left. No osseous destructive lesion is seen.   DEGENERATIVE CHANGES: C7-T3: Unremarkable.   T3-T4: Early left costovertebral degenerative joint disease. Central canal and foramina are patent.   T4-T6: Unremarkable.   T6-T7: Mild wedge deformity of T7 body left greater than right with bridging lateral endplate osteophytes. Right facet degenerative joint disease resulting in mild foraminal encroachment.   T7-T8: Moderate wedge deformity of the T8 body, left greater than right, with bridging left lateral endplate osteophytes.   T8-T10: Continued left lateral bridging by confluent endplate osteophytes.  T10-11: Anterior endplate spurs. Left facet degenerative joint disease with mild encroachment upon the neural foramen. Central canal widely patent.   T11-T12: Moderate anterior endplate osteophytes. Mild posterior disc bulge without cord distortion. No spinal stenosis. Foramina are patent.   T12-L1: Small anterior endplate spurs with early vacuum phenomenon. No disc bulge or protrusion. Central canal and foramina are patent.   Conus terminates behind L1. The visualized upper lumbar spine is unremarkable.   SOFT TISSUES: No paraspinal mass or fluid collection. Small hiatal hernia. The remainder of the visualized paraspinal soft tissues are unremarkable.   IMPRESSION: 1. Mild mid thoracic dextroscoliosis with apex at T8 without evident vertebral segmentation anomaly. 2. Mild wedge deformity of T7 right greater than left and moderate  wedge deformity of T8 left greater than right, with associated bridging osteophytes. 3. Right facet degenerative joint disease at T6-T7 resulting in mild foraminal encroachment. 4. Left facet degenerative joint disease at T10-11 with mild foraminal encroachment; central canal is widely patent. 5. Mild posterior disc bulge at T11-T12 without spinal stenosis; foramina are patent.  06/26/24 - MRI CERVICAL SPINE WITHOUT CONTRAST   CLINICAL DATA:  Weakness and numbness in the arms and legs   FINDINGS: The craniocervical junction is normal.   There is no significant  bone marrow signal abnormality.   The cervical spinal cord is normal.   C2-C3: Normal   C3-C4: Normal   C4-C5: Normal   C5-C6: Normal   C6-C7: Normal   C7-T1: Normal   IMPRESSION: Normal  06/26/24 - MRI LUMBAR SPINE WITHOUT CONTRAST CLINICAL DATA:  Back pain with weakness of the arms and legs  FINDINGS: Bone marrow: No significant abnormality   Conus and cauda equina: No significant abnormality   Paraspinal tissues: No significant abnormality   L1-L2: Normal   L2-L3: Normal   L3-L4: Normal   L4-L5: There is a small foraminal disc herniation on the right with mild stenosis of the right neural foramen   L5-S1: Slight desiccation of the disc with preservation of the disc height. The facet joints are normal.   IMPRESSION: Small right foraminal disc herniation at L4-5 with mild stenosis of the right neural foramen   Early degenerative disc disease at L5-S1.   Otherwise normal.  PATIENT SURVEYS:  Modified Oswestry:  MODIFIED OSWESTRY DISABILITY SCALE  Date:  Score - 09/14/2024   Pain intensity 4 =  Pain medication provides me with little relief from pain.  2. Personal care (washing, dressing, etc.) 1 =  I can take care of myself normally, but it increases my pain.  3. Lifting 3 = Pain prevents me from lifting heavy weights, but I can manage light to medium weights if they are conveniently positioned   4. Walking 4 = I can only walk with crutches or a cane.  5. Sitting 4 =  Pain prevents me from sitting more than 10 minutes.  6. Standing 4 =  Pain prevents me from standing more than 10 minutes.  7. Sleeping 4 =  Even when I take pain medication, I sleep less than 2 hour  8. Social Life 3 =  Pain prevents me from going out very often.  9. Traveling 2 =  My pain restricts my travel over 2 hours.  10. Employment/ Homemaking 4 = Pain prevents me from doing even light duties.  Total 33/50  % Disability 66% - Crippling   Interpretation of scores: Score Category Description  0-20% Minimal Disability The patient can cope with most living activities. Usually no treatment is indicated apart from advice on lifting, sitting and exercise  21-40% Moderate Disability The patient experiences more pain and difficulty with sitting, lifting and standing. Travel and social life are more difficult and they may be disabled from work. Personal care, sexual activity and sleeping are not grossly affected, and the patient can usually be managed by conservative means  41-60% Severe Disability Pain remains the main problem in this group, but activities of daily living are affected. These patients require a detailed investigation  61-80% Crippled Back pain impinges on all aspects of the patients life. Positive intervention is required  81-100% Bed-bound These patients are either bed-bound or exaggerating their symptoms  Bluford FORBES Zoe DELENA Karon DELENA, et al. Surgery versus conservative management of stable thoracolumbar fracture: the PRESTO feasibility RCT. [place] ([place]): [employer]; 2021 Nov. [facility] Technology Assessment, No. 25.62.) Appendix 3, Oswestry Disability Index category descriptors. Available from: Findjewelers.cz  Minimally Clinically Important Difference (MCID) = 12.8%  SCREENING FOR RED FLAGS: Bowel or bladder incontinence: No Spinal tumors: No Cauda equina  syndrome: No Compression fracture: No Abdominal aneurysm: No  COGNITION:  Overall cognitive status: Within functional limits for tasks assessed    SENSATION: WFL Pt reports occasional B UE & LE numbness and tingling  POSTURE:  rounded shoulders, forward head, increased thoracic kyphosis, and flexed trunk   PALPATION: Very TTP with increased muscle tension in R>L thoracolumbar paraspinals and periscapular muscles  THORACOLUMBAR ROM:   Active  Eval  Flexion Hands to knees  Extension 90% limited  Right lateral flexion Hand to proximal thigh  Left lateral flexion Hand to proximal thigh  Right rotation 60% limited  Left rotation 60% limited  (Blank rows = not tested)  UPPER EXTREMITY ROM: Limited OH flexion and ABD due pain   UPPER EXTREMITY MMT:  MMT Right eval Left eval  Shoulder flexion 3- p! 3 p!  Shoulder extension 3 p! 4-  Shoulder abduction    Shoulder adduction 3- p! 3  Shoulder extension    Shoulder internal rotation    Shoulder external rotation    Middle trapezius    Lower trapezius    Elbow flexion    Elbow extension    Wrist flexion    Wrist extension    Wrist ulnar deviation    Wrist radial deviation    Wrist pronation    Wrist supination    Grip strength 50# 52#   (Blank rows = not tested)  MUSCLE LENGTH: TBA as indicated Hamstrings:  ITB:  Piriformis:  Hip flexors:  Quads:  Heelcord:   LOWER EXTREMITY ROM: TBA as indicated   Active  Right eval Left eval  Hip flexion    Hip extension    Hip abduction    Hip adduction    Hip internal rotation    Hip external rotation    Knee flexion    Knee extension    Ankle dorsiflexion    Ankle plantarflexion    Ankle inversion    Ankle eversion    (Blank rows = not tested)  LOWER EXTREMITY MMT:  TBA as indicated  MMT Right eval Left eval  Hip flexion    Hip extension    Hip abduction    Hip adduction    Hip internal rotation    Hip external rotation    Knee flexion    Knee  extension    Ankle dorsiflexion    Ankle plantarflexion    Ankle inversion    Ankle eversion     (Blank rows = not tested)  GAIT: Distance walked: clinic distances Assistive device utilized: None Level of assistance: Complete Independence Gait pattern: trunk flexed   TODAY'S TREATMENT:    09/14/2024 - Eval SELF CARE:   Reviewed eval findings and role of PT in addressing identified deficits as well as instruction in initial HEP (see below).   Explained role of TPDN including TPDN rational, procedure, outcomes, potential side effects, and recommended post-treatment exercises/activity as well as fee schedule for TPDN and lack of insurance coverage requiring payment at time of service Provided education on home TENS unit options, and home TENS unit set up, including recommended electrode placement and parameters adjustment.    PATIENT EDUCATION:  Education details: PT eval findings, anticipated POC, initial HEP, role of TPDN, TPDN rational, procedure, outcomes, potential side effects, and recommended post-treatment exercises/activity, fee schedule for TPDN and lack of insurance coverage requiring payment at time of service, home TENS unit options, and home TENS unit set up, including recommended electrode placement and parameters adjustment  Person educated: Patient and Spouse Education method: Explanation, Demonstration, and Handouts Education comprehension: verbalized understanding and needs further education  HOME EXERCISE PROGRAM: Access Code: [facility] URL: [email or web address] Date: 09/14/2024 Prepared by: Marc Nguyen  Exercises - Seated Flexion  Stretch with [employer]  - 2 x daily - 7 x weekly - 3 reps - 30 sec hold  Patient Education - TENS Therapy - TENS UNIT - AUVON Dual Channel TENS Unit - Trigger Point Dry Needling   ASSESSMENT:  CLINICAL IMPRESSION: Marc Nguyen is a 34 y.o. male who was referred to physical therapy for evaluation and  treatment for thoracic radiculopathy.  CT myelogram thoracic spine reveals several bridging anterior osteophyte and scoliosis, but no spinal cord compression or foraminal narrowing.  Cervical and lumbar MRIs WNL.  Patient reports onset of R-sided thoracic pain beginning in April 2025.  Pain is worse with bending over, STS transition, prolonged stationary positioning, walking, and driving/riding in car or on motorcycle .  Patient has deficits in thoracolumbar ROM and core flexibility, B UE strength, abnormal posture, and TTP with abnormal muscle tension in R>L thoracolumbar paraspinals and periscapular muscles which are interfering with ADLs and are impacting quality of life.  On Modified Oswestry patient scored 33/50 demonstrating 66% or crippingly disability.  Marc Nguyen will benefit from skilled PT to address above deficits to improve mobility and activity tolerance with decreased pain interference.     OBJECTIVE IMPAIRMENTS: Abnormal gait, decreased activity tolerance, decreased endurance, decreased knowledge of condition, decreased mobility, difficulty walking, decreased ROM, decreased strength, hypomobility, increased fascial restrictions, impaired perceived functional ability, increased muscle spasms, impaired flexibility, impaired UE functional use, improper body mechanics, postural dysfunction, and pain.   ACTIVITY LIMITATIONS: carrying, lifting, bending, sitting, standing, squatting, sleeping, stairs, transfers, bathing, dressing, reach over head, hygiene/grooming, and locomotion level  PARTICIPATION LIMITATIONS: meal prep, cleaning, laundry, driving, shopping, community activity, occupation, and yard work  PERSONAL FACTORS: Fitness, Past/current experiences, Profession, Time since onset of injury/illness/exacerbation, and 3+ comorbidities: HTN, obesity, dyspepsia, R knee scope are also affecting patient's functional outcome.   REHAB POTENTIAL: Good  CLINICAL DECISION MAKING: Evolving/moderate  complexity  EVALUATION COMPLEXITY: Moderate   GOALS: Goals reviewed with patient? Yes  SHORT TERM GOALS: Target date: 10/12/2024  Patient will be independent with initial HEP to improve outcomes and carryover.  Baseline: prior PT exercises from outside clinic reviewed and HEP initiated on eval Goal status: INITIAL  2.  Patient will report 25% improvement in thoracic back pain to improve QOL. Baseline: 8/10 on eval, up to 10/10 Goal status: INITIAL  LONG TERM GOALS: Target date: 11/09/2024  Patient will be independent with ongoing/advanced HEP for self-management at home.  Baseline:  Goal status: INITIAL  2.  Patient will report 50-75% improvement in thoracic back pain to improve QOL.  Baseline: 8/10 on eval, up to 10/10 Goal status: INITIAL  3.  Patient to demonstrate ability to achieve and maintain good spinal alignment/posturing and body mechanics needed for daily activities. Baseline:  Goal status: INITIAL  4.  Patient will demonstrate functional pain free thoracolumbar ROM to perform ADLs.   Baseline: Refer to above lumbar ROM table Goal status: INITIAL  5.  Patient will report </= 54% on Modified Oswestry (MCID = 12%) to demonstrate improved functional ability with decreased pain interference. Baseline: 33/50 = 66% Goal status: INITIAL  6.  Patient will tolerate >/= 20-30 min of sitting, standing or walking w/o increased pain to allow for improved mobility and activity tolerance. Baseline: Pain prevents sitting or standing for more than 10 minutes and limits walking to short distances Goal status: INITIAL   PLAN:  PT FREQUENCY: 2x/week  PT DURATION: 8 weeks  PLANNED INTERVENTIONS: 97164- PT Re-evaluation, 97110-Therapeutic exercises, 97530- Therapeutic activity, [id number]- Neuromuscular  re-education, [phone]- Self Care, [id number]- Manual therapy, [id number]- Electrical stimulation (unattended), [id number]- Electrical stimulation (manual), [id number]- Traction (mechanical), [id number]-  Ionotophoresis 4mg /ml Dexamethasone, 20560 (1-2 muscles), 20561 (3+ muscles)- Dry Needling, Patient/Family education, Taping, Joint mobilization, Spinal mobilization, Cryotherapy, and Moist heat  PLAN FOR NEXT SESSION: MT +/- TPDN to address abnormal muscle tension in thoracolumbar paraspinals and periscapular muscles; trial of IFC/TENS to help with pain management; gentle thoracolumbar and periscapular mobilization and flexibility; possible trial of IontoPatch or Ktape tor reduce paraspinal inflammation/irritation   Marc CHRISTELLA Nguyen, PT 09/14/2024, 12:22 PM  "

## 2024-09-18 ENCOUNTER — Ambulatory Visit: Admitting: Physical Therapy

## 2024-09-25 ENCOUNTER — Encounter: Payer: Self-pay | Admitting: Physical Therapy

## 2024-09-25 ENCOUNTER — Ambulatory Visit: Admitting: Physical Therapy

## 2024-09-25 DIAGNOSIS — M5414 Radiculopathy, thoracic region: Secondary | ICD-10-CM

## 2024-09-25 DIAGNOSIS — M546 Pain in thoracic spine: Secondary | ICD-10-CM

## 2024-09-25 DIAGNOSIS — M62830 Muscle spasm of back: Secondary | ICD-10-CM

## 2024-09-25 DIAGNOSIS — R293 Abnormal posture: Secondary | ICD-10-CM

## 2024-09-25 DIAGNOSIS — M6281 Muscle weakness (generalized): Secondary | ICD-10-CM

## 2024-09-27 ENCOUNTER — Ambulatory Visit

## 2024-09-27 ENCOUNTER — Other Ambulatory Visit: Payer: Self-pay

## 2024-09-27 DIAGNOSIS — M6281 Muscle weakness (generalized): Secondary | ICD-10-CM

## 2024-09-27 DIAGNOSIS — M5414 Radiculopathy, thoracic region: Secondary | ICD-10-CM

## 2024-09-27 DIAGNOSIS — M546 Pain in thoracic spine: Secondary | ICD-10-CM

## 2024-09-27 DIAGNOSIS — R293 Abnormal posture: Secondary | ICD-10-CM

## 2024-09-27 DIAGNOSIS — M62830 Muscle spasm of back: Secondary | ICD-10-CM

## 2024-09-27 NOTE — Therapy (Signed)
 " OUTPATIENT PHYSICAL THERAPY TREATMENT   Patient Name: Marc Nguyen MRN: [id number] DOB:Apr 15, 1991, 34 y.o., male Today's Date: 09/27/2024  END OF SESSION:  PT End of Session - 09/27/24 1109     Visit Number 3    Date for Recertification  11/09/24    Authorization Type [employer]    PT Start Time [phone]    PT Stop Time 0930    PT Time Calculation (min) 43 min    Activity Tolerance Patient limited by pain    Behavior During Therapy [facility] for tasks assessed/performed           Past Medical History:  Diagnosis Date   Hypertension    Obesity    Past Surgical History:  Procedure Laterality Date   KNEE ARTHROSCOPY Right    Patient Active Problem List   Diagnosis Date Noted   Hypertension goal BP (blood pressure) < 130/80 12/04/2017   At risk for obstructive sleep apnea 12/04/2017   Dyspepsia 12/04/2017   Exertional chest pain 12/04/2017    PCP: Tommas Severa Norris, PA-C (Inactive)   REFERRING PROVIDER: Orlean Suzen Lacks, NP   REFERRING DIAG: M54.14 (ICD-10-CM) - Radiculopathy, thoracic region   THERAPY DIAG:  Pain in thoracic spine  Radiculopathy, thoracic region  Abnormal posture  Muscle weakness (generalized)  Muscle spasm of back  RATIONALE FOR EVALUATION AND TREATMENT: Rehabilitation  ONSET DATE: April 2025  NEXT MD VISIT: PRN   SUBJECTIVE:                                                                                                                                                                                                         SUBJECTIVE STATEMENT: Pt reports he locke up after last session, and was pretty painful through his thoracic region for 2 days.  Reports pain today at an 8/10. EVAL: Pt report constant R-sided thoracic back pain between shoulder blade and his spine.  Pain started gradually, but was then under a house putting some ductwork up when pain became unbearable.  Pain affects walking and makes rolling over in bed  very painful, waking him up at night.  Injections and ablation attempts have been unsuccessful in providing pain relief.  He reports occasional numbness and tingling in B UE & LE early on, but not as common recently.  Tried PT last year, but feels like this made things worse - slightly better response to seated thoracolumbar flexion stretch (relief not lasting) but thoracolumbar extension at wall and doorway pec stretch made things worse.  PAIN: Are you having pain? Yes: NPRS  scale: 9/10 currently, up to 10/10  Pain location: R thoracic spine between scapula and spine and under scapula  Pain description: constant pressure, sharp stabbing pain with certain movements  Aggravating factors: bending over, STS transition, prolonged stationary positioning, walking, driving/riding in car or motorcycle  Relieving factors: heating pad, stretches from prior HEP - flexion based better extension   PERTINENT HISTORY:  HTN, obesity, dyspepsia, R knee scope  PRECAUTIONS: None  RED FLAGS: None  WEIGHT BEARING RESTRICTIONS: No  FALLS:  Has patient fallen in last 6 months? Yes. 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   OBJECTIVE: (objective measures completed at initial evaluation unless otherwise dated)  DIAGNOSTIC FINDINGS:  CT myelogram thoracic spine: Per NP report, he does have several bridging anterior  osteophyte, no spinal cord compression or foraminal narrowing.  He does have scoliosis as well.  08/01/24 - THORACIC MYELOGRAM UNDER FLUOROSCOPY / CT OF THE THORACIC SPINE WITH INTRATHECAL CONTRAST CLINICAL HISTORY: Thoracic Radiculopathy   FINDINGS:   BONES AND ALIGNMENT: 12th rib - bearing thoracic segments assigned at T1 - T12. There is a mild mid thoracic dextroscoliosis with apex at T8 without evident underlying vertebral segmentation anomaly. The vertebral body heights are maintained, with the exception of a mild wedge deformity of T7 right greater than left. No osseous destructive lesion is seen.   DEGENERATIVE CHANGES: C7-T3: Unremarkable.   T3-T4: Early left costovertebral degenerative joint disease. Central canal and foramina are patent.   T4-T6: Unremarkable.   T6-T7: Mild wedge deformity of T7 body left greater than right with bridging lateral endplate osteophytes. Right facet degenerative joint disease resulting in mild foraminal encroachment.   T7-T8: Moderate wedge deformity of the T8 body, left greater than right, with bridging left lateral endplate osteophytes.   T8-T10: Continued left lateral bridging by confluent endplate osteophytes.  T10-11: Anterior endplate spurs. Left facet degenerative joint disease with mild encroachment upon the neural foramen. Central canal widely patent.   T11-T12: Moderate anterior endplate osteophytes. Mild posterior disc bulge without cord distortion. No spinal stenosis. Foramina are patent.   T12-L1: Small anterior endplate spurs with early vacuum phenomenon. No disc bulge or protrusion. Central canal and foramina are patent.   Conus terminates behind L1. The visualized upper lumbar spine is unremarkable.   SOFT TISSUES: No paraspinal mass or fluid collection. Small hiatal hernia. The remainder of the visualized paraspinal soft tissues are unremarkable.   IMPRESSION: 1. Mild mid thoracic dextroscoliosis with apex  at T8 without evident vertebral segmentation anomaly. 2. Mild wedge deformity of T7 right greater than left and moderate wedge deformity of T8 left greater than right, with associated bridging osteophytes. 3. Right facet degenerative joint disease at T6-T7 resulting in mild foraminal encroachment. 4. Left facet degenerative joint disease at T10-11 with mild foraminal encroachment; central canal is widely patent. 5. Mild posterior disc bulge at T11-T12 without spinal stenosis; foramina are patent.  06/26/24 - MRI CERVICAL SPINE WITHOUT CONTRAST  CLINICAL DATA:  Weakness and numbness in the arms and legs   FINDINGS: The craniocervical junction is normal.   There is no significant bone marrow signal abnormality.   The cervical spinal cord is normal.   C2-C3: Normal   C3-C4: Normal   C4-C5: Normal   C5-C6: Normal   C6-C7: Normal   C7-T1: Normal   IMPRESSION: Normal  06/26/24 - MRI LUMBAR SPINE WITHOUT CONTRAST CLINICAL DATA:  Back pain with weakness of the arms and legs  FINDINGS: Bone marrow: No significant abnormality   Conus and cauda equina: No significant abnormality   Paraspinal tissues: No significant abnormality   L1-L2: Normal   L2-L3: Normal   L3-L4: Normal   L4-L5: There is a small foraminal disc herniation on the right with mild stenosis of the right neural foramen   L5-S1: Slight desiccation of the disc with preservation of the disc height. The facet joints are normal.   IMPRESSION: Small right foraminal disc herniation at L4-5 with mild stenosis of the right neural foramen   Early degenerative disc disease at L5-S1.   Otherwise normal.  PATIENT SURVEYS:  Modified Oswestry:  MODIFIED OSWESTRY DISABILITY SCALE  Date:  Score - 09/14/2024   Pain intensity 4 =  Pain medication provides me with little relief from pain.  2. Personal care (washing, dressing, etc.) 1 =  I can take care of myself normally, but it increases my pain.  3. Lifting 3 =  Pain prevents me from lifting heavy weights, but I can manage light to medium weights if they are conveniently positioned  4. Walking 4 = I can only walk with crutches or a cane.  5. Sitting 4 =  Pain prevents me from sitting more than 10 minutes.  6. Standing 4 =  Pain prevents me from standing more than 10 minutes.  7. Sleeping 4 =  Even when I take pain medication, I sleep less than 2 hour  8. Social Life 3 =  Pain prevents me from going out very often.  9. Traveling 2 =  My pain restricts my travel over 2 hours.  10. Employment/ Homemaking 4 = Pain prevents me from doing even light duties.  Total 33/50  % Disability 66% - Crippling   Interpretation of scores: Score Category Description  0-20% Minimal Disability The patient can cope with most living activities. Usually no treatment is indicated apart from advice on lifting, sitting and exercise  21-40% Moderate Disability The patient experiences more pain and difficulty with sitting, lifting and standing. Travel and social life are more difficult and they may be disabled from work. Personal care, sexual activity and sleeping are not grossly affected, and the patient can usually be managed by conservative means  41-60% Severe Disability Pain remains the main problem in this group, but activities of daily living are affected. These patients require a detailed investigation  61-80% Crippled Back pain impinges on all aspects of the patients life. Positive intervention is required  81-100% Bed-bound These patients are either bed-bound or exaggerating their symptoms  Bluford FORBES Zoe DELENA Karon DELENA, et al. Surgery versus conservative management of stable thoracolumbar fracture: the PRESTO feasibility RCT. [place] ([place]): [employer]; 2021 Nov. [facility] Technology Assessment, No. 25.62.) Appendix 3, Oswestry Disability Index category descriptors. Available from: Findjewelers.cz  Minimally Clinically Important  Difference (MCID) = 12.8%  SCREENING FOR RED FLAGS: Bowel or bladder incontinence: No Spinal tumors: No Cauda equina syndrome: No Compression fracture: No Abdominal aneurysm: No  COGNITION:  Overall cognitive status: Within functional limits for tasks assessed    SENSATION: WFL Pt reports occasional B UE & LE numbness and tingling   POSTURE:  rounded shoulders, forward head, increased thoracic kyphosis, and flexed trunk   PALPATION: Very TTP with increased muscle tension in R>L thoracolumbar paraspinals and periscapular muscles  THORACOLUMBAR ROM:   Active  Eval  Flexion Hands to knees  Extension 90% limited  Right lateral flexion Hand to proximal thigh  Left lateral flexion Hand to proximal thigh  Right rotation 60% limited  Left rotation 60% limited  (Blank rows = not tested)  UPPER EXTREMITY ROM: Limited OH flexion and ABD due pain   UPPER EXTREMITY MMT:  MMT Right eval Left eval  Shoulder flexion 3- p! 3 p!  Shoulder extension 3 p! 4-  Shoulder abduction    Shoulder adduction 3- p! 3  Shoulder extension    Shoulder internal rotation    Shoulder external rotation    Middle trapezius    Lower trapezius    Elbow flexion    Elbow extension    Wrist flexion    Wrist extension    Wrist ulnar deviation    Wrist radial deviation    Wrist pronation    Wrist supination    Grip strength 50# 52#   (Blank rows = not tested)  MUSCLE LENGTH: TBA as indicated Hamstrings:  ITB:  Piriformis:  Hip flexors:  Quads:  Heelcord:   LOWER EXTREMITY ROM: TBA as indicated   Active  Right eval Left eval  Hip flexion    Hip extension    Hip abduction    Hip adduction    Hip internal rotation    Hip external rotation    Knee flexion    Knee extension    Ankle dorsiflexion    Ankle plantarflexion    Ankle inversion    Ankle eversion    (Blank rows = not tested)  LOWER EXTREMITY MMT:  TBA as indicated  MMT Right eval Left eval  Hip flexion    Hip extension     Hip abduction    Hip adduction    Hip internal rotation    Hip external rotation    Knee flexion    Knee extension    Ankle dorsiflexion    Ankle plantarflexion    Ankle inversion    Ankle eversion     (Blank rows = not tested)  GAIT: Distance walked: clinic distances Assistive device utilized: None Level of assistance: Complete Independence Gait pattern: trunk flexed   TODAY'S TREATMENT:  09/27/24: Education with spinal model regarding the purpose of the exercise and principles of need for movement for jt nutrition, gentle mid range movements to stimulate production of synovial fluid .   Instructed in alt overhead rows with blue t band over door, to incorporate spinal rotation and some decompression on each side Instructed in side lying thoracic rotation , leading with superior arm, and adding some trunk side bending   Seated for IFC with 4 pads crossed over thoracic region T 7 to 11 for 10 min, utilized as trial for pt to determine whether a TENS unit would be beneficial for him for pain management at home.  09/25/2024  THERAPEUTIC EXERCISE: To improve ROM and flexibility.  Demonstration, verbal and tactile cues throughout for technique.  UBE: L1.0 x 2' each fwd & back Seated gentle scapular retraction and depression  Seated backward shoulder rolls Left S/L bow and arrow R thoracolumbar rotation x 10  MODALITIES:  TENS (AUVON unit) to B thoracolumbar paraspinals in Intersecting pattern, intensity to patient tolerance x 15 minutes in conjunction with moist heat pack to mid/low back.  Limited adherence with electrodes due to extent of hair on his back - suggested shaving his back if her were to purchase a home unit.  MANUAL THERAPY: To promote normalized muscle tension, improved flexibility, improved joint mobility, increased ROM, and reduced pain utilizing joint mobilization, connective tissue massage, therapeutic massage, manual TP therapy, and myofascial release.  STM/DTM and  manual TPR as tolerated to R thoracolumbar paraspinals, rhomboids, lower traps and lats R subscapular release R scapular mobilization - all directions   09/14/2024 - Eval SELF CARE:   Reviewed eval findings and role of PT in addressing identified deficits as well as instruction in initial HEP (see below).   Explained role of TPDN including TPDN rational, procedure, outcomes, potential side effects, and recommended post-treatment exercises/activity as well as fee schedule for TPDN and lack of insurance coverage requiring payment at time of service Provided education on home TENS unit options, and home TENS unit set up, including recommended electrode placement and parameters adjustment.    PATIENT EDUCATION:  Education details: home TENS unit options, home TENS unit set up, including recommended electrode placement and parameters adjustment, and possible trial of Ktape (will need to shave his back if interested)  Person educated: Patient and Spouse Education method: Explanation and Demonstration Education comprehension: verbalized understanding and needs further education  HOME EXERCISE PROGRAM: Access Code: [facility] URL: [email or web address] Date: 09/14/2024 Prepared by: Elijah Hidden  Exercises - Seated Flexion Stretch with Swiss Ball  - 2 x daily - 7 x weekly - 3 reps - 30 sec hold  Patient Education - TENS Therapy - TENS UNIT - AUVON Dual Channel TENS Unit - Trigger Point Dry Needling   ASSESSMENT:  CLINICAL IMPRESSION: Wilkie continued to report high levels of pain with very limited movement tolerance and continued hypersensitivity/increased pain even with light touch.  We opted not to utilize manual techniques due to his high tissue irritability.  He did have his back shaved so we were able to utilize the IFC to replicate TENS unit.  Also extensive education regarding the benefits of jt nutrition, to perform small / mid jt movements to stimulate production of jt  synovial fluid and maintain his current mobility.  Also discussed and advised him of the benefits of walking in a pool with the water above the area of his pain for some traction. Radin will continue to benefit from skilled PT to address ongoing postural, abnormal muscle tension, ROM and strength deficits to improve mobility and activity tolerance with decreased pain interference.   EVAL: Dekari Bures is a 34 y.o. male who was referred to physical therapy for evaluation and treatment for thoracic radiculopathy.  CT myelogram thoracic spine reveals several bridging anterior osteophyte and scoliosis, but no spinal cord compression or foraminal narrowing.  Cervical and lumbar MRIs WNL.  Patient reports onset of R-sided thoracic pain beginning in April 2025.  Pain is worse with bending over, STS transition, prolonged stationary positioning, walking, and driving/riding in car or on motorcycle .  Patient has deficits in thoracolumbar ROM and core flexibility, B UE strength, abnormal posture, and TTP with abnormal muscle tension in R>L thoracolumbar paraspinals and periscapular muscles which are interfering with ADLs and are impacting quality of life.  On Modified Oswestry patient scored 33/50 demonstrating 66% or crippingly disability.  Alassane will benefit from skilled PT to address  above deficits to improve mobility and activity tolerance with decreased pain interference.     OBJECTIVE IMPAIRMENTS: Abnormal gait, decreased activity tolerance, decreased endurance, decreased knowledge of condition, decreased mobility, difficulty walking, decreased ROM, decreased strength, hypomobility, increased fascial restrictions, impaired perceived functional ability, increased muscle spasms, impaired flexibility, impaired UE functional use, improper body mechanics, postural dysfunction, and pain.   ACTIVITY LIMITATIONS: carrying, lifting, bending, sitting, standing, squatting, sleeping, stairs, transfers, bathing, dressing,  reach over head, hygiene/grooming, and locomotion level  PARTICIPATION LIMITATIONS: meal prep, cleaning, laundry, driving, shopping, community activity, occupation, and yard work  PERSONAL FACTORS: Fitness, Past/current experiences, Profession, Time since onset of injury/illness/exacerbation, and 3+ comorbidities: HTN, obesity, dyspepsia, R knee scope are also affecting patient's functional outcome.   REHAB POTENTIAL: Good  CLINICAL DECISION MAKING: Evolving/moderate complexity  EVALUATION COMPLEXITY: Moderate   GOALS: Goals reviewed with patient? Yes  SHORT TERM GOALS: Target date: 10/12/2024  Patient will be independent with initial HEP to improve outcomes and carryover.  Baseline: prior PT exercises from outside clinic reviewed and HEP initiated on eval Goal status: IN PROGRESS - 09/25/24  2.  Patient will report 25% improvement in thoracic back pain to improve QOL. Baseline: 8/10 on eval, up to 10/10 Goal status: INITIAL  LONG TERM GOALS: Target date: 11/09/2024  Patient will be independent with ongoing/advanced HEP for self-management at home.  Baseline:  Goal status: INITIAL  2.  Patient will report 50-75% improvement in thoracic back pain to improve QOL.  Baseline: 8/10 on eval, up to 10/10 Goal status: INITIAL  3.  Patient to demonstrate ability to achieve and maintain good spinal alignment/posturing and body mechanics needed for daily activities. Baseline:  Goal status: INITIAL  4.  Patient will demonstrate functional pain free thoracolumbar ROM to perform ADLs.   Baseline: Refer to above lumbar ROM table Goal status: INITIAL  5.  Patient will report </= 54% on Modified Oswestry (MCID = 12%) to demonstrate improved functional ability with decreased pain interference. Baseline: 33/50 = 66% Goal status: INITIAL  6.  Patient will tolerate >/= 20-30 min of sitting, standing or walking w/o increased pain to allow for improved mobility and activity tolerance. Baseline:  Pain prevents sitting or standing for more than 10 minutes and limits walking to short distances Goal status: INITIAL   PLAN:  PT FREQUENCY: 2x/week  PT DURATION: 8 weeks  PLANNED INTERVENTIONS: 97164- PT Re-evaluation, 97110-Therapeutic exercises, 97530- Therapeutic activity, 97112- Neuromuscular re-education, 97535- Self Care, [id number]- Manual therapy, [id number]- Electrical stimulation (unattended), [phone]- Electrical stimulation (manual), [id number]- Traction (mechanical), [id number]- Ionotophoresis 4mg /ml Dexamethasone, 79439 (1-2 muscles), 20561 (3+ muscles)- Dry Needling, Patient/Family education, Taping, Joint mobilization, Spinal mobilization, Cryotherapy, and Moist heat  PLAN FOR NEXT SESSION: MT +/- TPDN to address abnormal muscle tension in thoracolumbar paraspinals and periscapular muscles; trial of IFC/TENS to help with pain management; gentle thoracolumbar and periscapular mobilization and flexibility; possible trial of IontoPatch or Ktape tor reduce paraspinal inflammation/irritation if patient shaves his back   Jacquees Gongora L Duan Scharnhorst, PT, DPT, OCS 09/27/2024, 11:11 AM  "

## 2024-10-02 ENCOUNTER — Ambulatory Visit: Admitting: Physical Therapy

## 2024-10-05 ENCOUNTER — Ambulatory Visit: Admitting: Physical Therapy

## 2024-10-09 ENCOUNTER — Ambulatory Visit

## 2024-10-12 ENCOUNTER — Ambulatory Visit: Admitting: Physical Therapy
